# Patient Record
Sex: Female | Born: 1985 | Race: Black or African American | Hispanic: No | Marital: Single | Smoking: Never smoker
Health system: Southern US, Community
[De-identification: ages and names within clinical notes are randomized; demographics above are authoritative.]

## PROBLEM LIST (undated history)

## (undated) DIAGNOSIS — J45909 Unspecified asthma, uncomplicated: Secondary | ICD-10-CM

## (undated) DIAGNOSIS — M549 Dorsalgia, unspecified: Secondary | ICD-10-CM

## (undated) HISTORY — PX: OTHER SURGICAL HISTORY: SHX169

## (undated) HISTORY — DX: Dorsalgia, unspecified: M54.9

---
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
## 2007-11-29 ENCOUNTER — Emergency Department: Payer: Self-pay | Admitting: Internal Medicine

## 2008-02-04 ENCOUNTER — Emergency Department (HOSPITAL_COMMUNITY): Admission: EM | Admit: 2008-02-04 | Discharge: 2008-02-04 | Payer: Self-pay | Admitting: Emergency Medicine

## 2008-02-18 ENCOUNTER — Emergency Department: Payer: Self-pay | Admitting: Emergency Medicine

## 2008-02-20 ENCOUNTER — Inpatient Hospital Stay (HOSPITAL_COMMUNITY): Admission: AD | Admit: 2008-02-20 | Discharge: 2008-02-20 | Payer: Self-pay | Admitting: Obstetrics & Gynecology

## 2008-04-19 ENCOUNTER — Emergency Department: Payer: Self-pay | Admitting: Emergency Medicine

## 2008-08-11 ENCOUNTER — Emergency Department: Payer: Self-pay | Admitting: Emergency Medicine

## 2009-01-04 ENCOUNTER — Emergency Department (HOSPITAL_COMMUNITY): Admission: EM | Admit: 2009-01-04 | Discharge: 2009-01-04 | Payer: Self-pay | Admitting: Emergency Medicine

## 2009-09-10 ENCOUNTER — Emergency Department: Payer: Self-pay | Admitting: Emergency Medicine

## 2009-12-17 ENCOUNTER — Emergency Department: Payer: Self-pay | Admitting: Emergency Medicine

## 2011-02-13 LAB — URINALYSIS, ROUTINE W REFLEX MICROSCOPIC
Glucose, UA: NEGATIVE
Ketones, ur: NEGATIVE
Nitrite: NEGATIVE
Protein, ur: NEGATIVE
Urobilinogen, UA: 0.2

## 2011-02-13 LAB — WET PREP, GENITAL
Clue Cells Wet Prep HPF POC: NONE SEEN
Trich, Wet Prep: NONE SEEN
WBC, Wet Prep HPF POC: NONE SEEN
Yeast Wet Prep HPF POC: NONE SEEN

## 2011-02-14 LAB — POCT PREGNANCY, URINE: Preg Test, Ur: NEGATIVE

## 2012-06-05 ENCOUNTER — Ambulatory Visit: Payer: Self-pay | Admitting: Family Medicine

## 2012-06-05 LAB — RAPID INFLUENZA A&B ANTIGENS

## 2012-07-03 ENCOUNTER — Ambulatory Visit: Payer: Self-pay | Admitting: Internal Medicine

## 2012-07-31 ENCOUNTER — Emergency Department: Payer: Self-pay | Admitting: Emergency Medicine

## 2014-11-25 DIAGNOSIS — M549 Dorsalgia, unspecified: Secondary | ICD-10-CM | POA: Insufficient documentation

## 2014-11-25 HISTORY — DX: Dorsalgia, unspecified: M54.9

## 2015-01-21 ENCOUNTER — Encounter: Payer: Self-pay | Admitting: Emergency Medicine

## 2015-01-21 ENCOUNTER — Emergency Department
Admission: EM | Admit: 2015-01-21 | Discharge: 2015-01-21 | Disposition: A | Discharge status: Home / Self Care | Payer: BLUE CROSS/BLUE SHIELD | Attending: Emergency Medicine | Admitting: Emergency Medicine

## 2015-01-21 ENCOUNTER — Emergency Department: Payer: BLUE CROSS/BLUE SHIELD

## 2015-01-21 DIAGNOSIS — Y9389 Activity, other specified: Secondary | ICD-10-CM | POA: Diagnosis not present

## 2015-01-21 DIAGNOSIS — S20211A Contusion of right front wall of thorax, initial encounter: Secondary | ICD-10-CM | POA: Diagnosis not present

## 2015-01-21 DIAGNOSIS — S40812A Abrasion of left upper arm, initial encounter: Secondary | ICD-10-CM | POA: Insufficient documentation

## 2015-01-21 DIAGNOSIS — S301XXA Contusion of abdominal wall, initial encounter: Secondary | ICD-10-CM | POA: Diagnosis not present

## 2015-01-21 DIAGNOSIS — Z3202 Encounter for pregnancy test, result negative: Secondary | ICD-10-CM | POA: Diagnosis not present

## 2015-01-21 DIAGNOSIS — R102 Pelvic and perineal pain: Secondary | ICD-10-CM | POA: Diagnosis not present

## 2015-01-21 DIAGNOSIS — Y998 Other external cause status: Secondary | ICD-10-CM | POA: Diagnosis not present

## 2015-01-21 DIAGNOSIS — S299XXA Unspecified injury of thorax, initial encounter: Secondary | ICD-10-CM | POA: Diagnosis present

## 2015-01-21 DIAGNOSIS — Y9289 Other specified places as the place of occurrence of the external cause: Secondary | ICD-10-CM | POA: Insufficient documentation

## 2015-01-21 HISTORY — DX: Unspecified asthma, uncomplicated: J45.909

## 2015-01-21 LAB — BASIC METABOLIC PANEL
ANION GAP: 6 (ref 5–15)
BUN: 11 mg/dL (ref 6–20)
CALCIUM: 8.9 mg/dL (ref 8.9–10.3)
CO2: 27 mmol/L (ref 22–32)
Chloride: 107 mmol/L (ref 101–111)
Creatinine, Ser: 0.98 mg/dL (ref 0.44–1.00)
GFR calc non Af Amer: >60 mL/min (ref >60)
Glucose, Bld: 130 mg/dL — ABNORMAL HIGH (ref 65–99)
Potassium: 3.2 mmol/L — ABNORMAL LOW (ref 3.5–5.1)
SODIUM: 140 mmol/L (ref 135–145)

## 2015-01-21 LAB — URINALYSIS COMPLETE WITH MICROSCOPIC (ARMC ONLY)
BACTERIA UA: NONE SEEN
BILIRUBIN URINE: NEGATIVE
HGB URINE DIPSTICK: NEGATIVE
KETONES UR: NEGATIVE mg/dL
LEUKOCYTES UA: NEGATIVE
NITRITE: NEGATIVE
Protein, ur: NEGATIVE mg/dL
RBC / HPF: NONE SEEN RBC/hpf (ref 0–5)
SPECIFIC GRAVITY, URINE: 1.015 (ref 1.005–1.030)
pH: 6 (ref 5.0–8.0)

## 2015-01-21 MED ORDER — CYCLOBENZAPRINE HCL 5 MG PO TABS: 5.0000 mg | ORAL_TABLET | Freq: Three times a day (TID) | ORAL | Status: DC | PRN

## 2015-01-21 MED ORDER — IOHEXOL 300 MG/ML  SOLN
100.0000 mL | Freq: Once | INTRAMUSCULAR | Status: AC | PRN
  Administered 2015-01-21: 100 mL via INTRAVENOUS
  Filled 2015-01-21: qty 100

## 2015-01-21 MED ORDER — IBUPROFEN 800 MG PO TABS: 800.0000 mg | ORAL_TABLET | Freq: Three times a day (TID) | ORAL | Status: DC | PRN

## 2015-01-21 MED ORDER — CYCLOBENZAPRINE HCL 10 MG PO TABS
10.0000 mg | ORAL_TABLET | Freq: Once | ORAL | Status: AC
  Administered 2015-01-21: 10 mg via ORAL
  Filled 2015-01-21: qty 1

## 2015-01-21 MED ORDER — KETOROLAC TROMETHAMINE 60 MG/2ML IM SOLN
60.0000 mg | Freq: Once | INTRAMUSCULAR | Status: AC
  Administered 2015-01-21: 60 mg via INTRAMUSCULAR
  Filled 2015-01-21: qty 2

## 2015-01-21 NOTE — ED Notes (Signed)
POC pregnancy negative
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
## 2015-01-21 NOTE — Discharge Instructions (Signed)
Assault, General Assault includes any behavior, whether intentional or reckless, which results in bodily injury to another person and/or damage to property. Included in this would be any behavior, intentional or reckless, that by its nature would be understood (interpreted) by a reasonable person as intent to harm another person or to damage his/her property. Threats may be oral or written. They may be communicated through regular mail, computer, fax, or phone. These threats may be direct or implied. FORMS OF ASSAULT INCLUDE:  Physically assaulting a person. This includes physical threats to inflict physical harm as well as:  Slapping.  Hitting.  Poking.  Kicking.  Punching.  Pushing.  Arson.  Sabotage.  Equipment vandalism.  Damaging or destroying property.  Throwing or hitting objects.  Displaying a weapon or an object that appears to be a weapon in a threatening manner.  Carrying a firearm of any kind.  Using a weapon to harm someone.  Using greater physical size/strength to intimidate another.  Making intimidating or threatening gestures.  Bullying.  Hazing.  Intimidating, threatening, hostile, or abusive language directed toward another person.  It communicates the intention to engage in violence against that person. And it leads a reasonable person to expect that violent behavior may occur.  Stalking another person. IF IT HAPPENS AGAIN:  Immediately call for emergency help (911 in [place]  If someone poses clear and immediate danger to you, seek legal authorities to have a protective or restraining order put in place.  Less threatening assaults can at least be reported to authorities. STEPS TO TAKE IF A SEXUAL ASSAULT HAS HAPPENED  Go to an area of safety. This may include a shelter or staying with a friend. Stay away from the area where you have been attacked. A large percentage of sexual assaults are caused by a friend, relative or associate.  If  medications were given by your caregiver, take them as directed for the full length of time prescribed.  Only take over-the-counter or prescription medicines for pain, discomfort, or fever as directed by your caregiver.  If you have come in contact with a sexual disease, find out if you are to be tested again. If your caregiver is concerned about the HIV/AIDS virus, he/she may require you to have continued testing for several months.  For the protection of your privacy, test results can not be given over the phone. Make sure you receive the results of your test. If your test results are not back during your visit, make an appointment with your caregiver to find out the results. Do not assume everything is normal if you have not heard from your caregiver or the medical facility. It is important for you to follow up on all of your test results.  File appropriate papers with authorities. This is important in all assaults, even if it has occurred in a family or by a friend. SEEK MEDICAL CARE IF:  You have new problems because of your injuries.  You have problems that may be because of the medicine you are taking, such as:  Rash.  Itching.  Swelling.  Trouble breathing.  You develop belly (abdominal) pain, feel sick to your stomach (nausea) or are vomiting.  You begin to run a temperature.  You need supportive care or referral to a rape crisis center. These are centers with trained personnel who can help you get through this ordeal. SEEK IMMEDIATE MEDICAL CARE IF:  You are afraid of being threatened, beaten, or abused. In [place] call 911.  You  receive new injuries related to abuse.  You develop severe pain in any area injured in the assault or have any change in your condition that concerns you.  You faint or lose consciousness.  You develop chest pain or shortness of breath. Document Released: 05/01/2005 Document Revised: 07/24/2011 Document Reviewed: 12/18/2007 [facility] Patient  Information 2015 [place], [place]. This information is not intended to replace advice given to you by your health care provider. Make sure you discuss any questions you have with your health care provider.  Blunt Abdominal Trauma A blunt injury to the abdomen can cause pain. The pain is most likely from bruising and stretching of your muscles. This pain is often made worse with movement. Most often these injuries are not serious and get better within 1 week with rest and mild pain medicine. However, internal organs (liver, spleen, kidneys) can be injured with blunt trauma. If you do not get better or if you get worse, further examination may be needed. Continue with your regular daily activities, but avoid any strenuous activities until your pain is improved. If your stomach is upset, stick to a clear liquid diet and slowly advance to solid food.  SEEK IMMEDIATE MEDICAL CARE IF:   You develop increasing pain, nausea, or repeated vomiting.  You develop chest pain or breathing difficulty.  You develop blood in the urine, vomit, or stool.  You develop weakness, fainting, fever, or other serious complaints. Document Released: 06/08/2004 Document Revised: 07/24/2011 Document Reviewed: 09/24/2008 [facility] Patient Information 2015 [place], [place]. This information is not intended to replace advice given to you by your health care provider. Make sure you discuss any questions you have with your health care provider.  Chest Contusion A contusion is a deep bruise. Bruises happen when an injury causes bleeding under the skin. Signs of bruising include pain, puffiness (swelling), and discolored skin. The bruise may turn blue, purple, or yellow.  HOME CARE  Put ice on the injured area.  Put ice in a plastic bag.  Place a towel between the skin and the bag.  Leave the ice on for 15-20 minutes at a time, 03-04 times a day for the first 48 hours.  Only take medicine as told by your  doctor.  Rest.  Take deep breaths (deep-breathing exercises) as told by your doctor.  Stop smoking if you smoke.  Do not lift objects over 5 pounds (2.3 kilograms) for 3 days or longer if told by your doctor. GET HELP RIGHT AWAY IF:   You have more bruising or puffiness.  You have pain that gets worse.  You have trouble breathing.  You are dizzy, weak, or pass out (faint).  You have blood in your pee (urine) or poop (stool).  You cough up or throw up (vomit) blood.  Your puffiness or pain is not helped with medicines. MAKE SURE YOU:   Understand these instructions.  Will watch your condition.  Will get help right away if you are not doing well or get worse. Document Released: 10/18/2007 Document Revised: 01/24/2012 Document Reviewed: 10/23/2011 [facility] Patient Information 2015 [place], [place]. This information is not intended to replace advice given to you by your health care provider. Make sure you discuss any questions you have with your health care provider.  Rib Contusion A rib contusion (bruise) can occur by a blow to the chest or by a fall against a hard object. Usually these will be much better in a couple weeks. If X-rays were taken today and there are no  broken bones (fractures), the diagnosis of bruising is made. However, broken ribs may not show up for several days, or may be discovered later on a routine X-ray when signs of healing show up. If this happens to you, it does not mean that something was missed on the X-ray, but simply that it did not show up on the first X-rays. Earlier diagnosis will not usually change the treatment. HOME CARE INSTRUCTIONS   Avoid strenuous activity. Be careful during activities and avoid bumping the injured ribs. Activities that pull on the injured ribs and cause pain should be avoided, if possible.  For the first day or two, an ice pack used every 20 minutes while awake may be helpful. Put ice in a plastic bag and put a towel  between the bag and the skin.  Eat a normal, well-balanced diet. Drink plenty of fluids to avoid constipation.  Take deep breaths several times a day to keep lungs free of infection. Try to cough several times a day. Splint the injured area with a pillow while coughing to ease pain. Coughing can help prevent pneumonia.  Wear a rib belt or binder only if told to do so by your caregiver. If you are wearing a rib belt or binder, you must do the breathing exercises as directed by your caregiver. If not used properly, rib belts or binders restrict breathing which can lead to pneumonia.  Only take over-the-counter or prescription medicines for pain, discomfort, or fever as directed by your caregiver. SEEK MEDICAL CARE IF:   You or your child has an oral temperature above 102 F (38.9 C).  Your baby is older than 3 months with a rectal temperature of 100.5 F (38.1 C) or higher for more than 1 day.  You develop a cough, with thick or bloody sputum. SEEK IMMEDIATE MEDICAL CARE IF:   You have difficulty breathing.  You feel sick to your stomach (nausea), have vomiting or belly (abdominal) pain.  You have worsening pain, not controlled with medications, or there is a change in the location of the pain.  You develop sweating or radiation of the pain into the arms, jaw or shoulders, or become light headed or faint.  You or your child has an oral temperature above 102 F (38.9 C), not controlled by medicine.  Your or your baby is older than 3 months with a rectal temperature of 102 F (38.9 C) or higher.  Your baby is 85 months old or younger with a rectal temperature of 100.4 F (38 C) or higher. MAKE SURE YOU:   Understand these instructions.  Will watch your condition.  Will get help right away if you are not doing well or get worse. Document Released: 01/24/2001 Document Revised: 08/26/2012 Document Reviewed: 12/18/2007 [facility] Patient Information 2015 [place], [place]. This  information is not intended to replace advice given to you by your health care provider. Make sure you discuss any questions you have with your health care provider.

## 2015-01-21 NOTE — ED Notes (Signed)
Patient transported to CT 
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
## 2015-01-22 LAB — POCT PREGNANCY, URINE: PREG TEST UR: NEGATIVE

## 2015-09-23 DIAGNOSIS — M79643 Pain in unspecified hand: Secondary | ICD-10-CM | POA: Insufficient documentation

## 2015-09-28 ENCOUNTER — Encounter: Payer: Self-pay | Admitting: Internal Medicine

## 2015-09-29 ENCOUNTER — Ambulatory Visit: Payer: Self-pay | Admitting: Internal Medicine

## 2015-10-14 ENCOUNTER — Telehealth: Payer: Self-pay

## 2015-10-14 NOTE — Telephone Encounter (Signed)
advised

## 2015-10-14 NOTE — Telephone Encounter (Signed)
Can we do FMLA paper at her Establish Care for Carpal Tunnel? She has been out of work more than 3 days.

## 2015-10-14 NOTE — Telephone Encounter (Signed)
I can't make any promises since I have never seen her for or diagnosed her with carpal tunnel syndrome.

## 2015-11-03 ENCOUNTER — Other Ambulatory Visit: Payer: Self-pay | Admitting: Internal Medicine

## 2015-11-03 ENCOUNTER — Ambulatory Visit (INDEPENDENT_AMBULATORY_CARE_PROVIDER_SITE_OTHER): Payer: BLUE CROSS/BLUE SHIELD | Admitting: Internal Medicine

## 2015-11-03 ENCOUNTER — Encounter: Payer: Self-pay | Admitting: Internal Medicine

## 2015-11-03 VITALS — BP 116/76 | HR 84 | Resp 16 | Ht 61.2 in | Wt 206.0 lb

## 2015-11-03 DIAGNOSIS — M79643 Pain in unspecified hand: Secondary | ICD-10-CM

## 2015-11-03 DIAGNOSIS — J4520 Mild intermittent asthma, uncomplicated: Secondary | ICD-10-CM | POA: Diagnosis not present

## 2015-11-03 DIAGNOSIS — N760 Acute vaginitis: Secondary | ICD-10-CM | POA: Diagnosis not present

## 2015-11-03 MED ORDER — METRONIDAZOLE 500 MG PO TABS: 500.0000 mg | ORAL_TABLET | Freq: Two times a day (BID) | ORAL | Status: DC

## 2015-11-03 MED ORDER — FLUCONAZOLE 100 MG PO TABS: 100.0000 mg | ORAL_TABLET | Freq: Every day | ORAL | Status: DC

## 2015-11-03 MED ORDER — ALBUTEROL SULFATE HFA 108 (90 BASE) MCG/ACT IN AERS: 2.0000 | INHALATION_SPRAY | Freq: Four times a day (QID) | RESPIRATORY_TRACT | Status: DC | PRN
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
## 2015-12-01 ENCOUNTER — Encounter: Payer: Self-pay | Admitting: Internal Medicine

## 2015-12-01 ENCOUNTER — Ambulatory Visit (INDEPENDENT_AMBULATORY_CARE_PROVIDER_SITE_OTHER): Payer: BLUE CROSS/BLUE SHIELD | Admitting: Internal Medicine

## 2015-12-01 VITALS — BP 108/76 | HR 90 | Temp 98.4°F | Resp 16 | Ht 61.4 in | Wt 208.0 lb

## 2015-12-01 DIAGNOSIS — K047 Periapical abscess without sinus: Secondary | ICD-10-CM

## 2015-12-01 DIAGNOSIS — J029 Acute pharyngitis, unspecified: Secondary | ICD-10-CM

## 2015-12-01 MED ORDER — AMOXICILLIN-POT CLAVULANATE 875-125 MG PO TABS: 1.0000 | ORAL_TABLET | Freq: Two times a day (BID) | ORAL | Status: DC

## 2015-12-01 MED ORDER — IBUPROFEN 800 MG PO TABS: 800.0000 mg | ORAL_TABLET | Freq: Three times a day (TID) | ORAL | Status: DC | PRN
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
## 2016-05-12 ENCOUNTER — Other Ambulatory Visit: Payer: Self-pay | Admitting: Internal Medicine

## 2016-05-12 ENCOUNTER — Other Ambulatory Visit: Payer: Self-pay

## 2016-05-12 ENCOUNTER — Telehealth: Payer: Self-pay

## 2016-05-12 NOTE — Telephone Encounter (Signed)
Patient says she called several times last few weeks and never gets a call back. She said she gets put on hold for hours anytime she calls. Wanted appt at 4:30 today and I advised we can not see her today and offered her Tue appt. 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 I explained she has not seen us since July and that we can not send meds like that in without seeing patient. Scheduled for Jan 5. Patient aware we do not do narcotics.

## 2016-05-12 NOTE — Telephone Encounter (Signed)
She established care in June 2017 with me for the first time.  I saw once more for dental pain and prescribed Motrin.  Will see her at her appointment as scheduled.

## 2016-05-19 ENCOUNTER — Ambulatory Visit: Payer: Self-pay | Admitting: Internal Medicine

## 2016-05-22 ENCOUNTER — Ambulatory Visit (INDEPENDENT_AMBULATORY_CARE_PROVIDER_SITE_OTHER): Payer: BLUE CROSS/BLUE SHIELD | Admitting: Internal Medicine

## 2016-05-22 ENCOUNTER — Encounter: Payer: Self-pay | Admitting: Internal Medicine

## 2016-05-22 VITALS — BP 118/86 | HR 86 | Temp 97.6°F | Ht 62.0 in | Wt 196.0 lb

## 2016-05-22 DIAGNOSIS — M79643 Pain in unspecified hand: Secondary | ICD-10-CM | POA: Diagnosis not present

## 2016-05-22 DIAGNOSIS — G8929 Other chronic pain: Secondary | ICD-10-CM

## 2016-05-22 DIAGNOSIS — M542 Cervicalgia: Secondary | ICD-10-CM | POA: Diagnosis not present

## 2016-05-22 DIAGNOSIS — J4520 Mild intermittent asthma, uncomplicated: Secondary | ICD-10-CM | POA: Diagnosis not present

## 2016-05-22 MED ORDER — ALBUTEROL SULFATE HFA 108 (90 BASE) MCG/ACT IN AERS: 2.0000 | INHALATION_SPRAY | Freq: Four times a day (QID) | RESPIRATORY_TRACT | 5 refills | Status: DC | PRN

## 2016-05-22 MED ORDER — IBUPROFEN 800 MG PO TABS: 800.0000 mg | ORAL_TABLET | Freq: Three times a day (TID) | ORAL | 5 refills | Status: DC | PRN

## 2016-05-22 MED ORDER — CYCLOBENZAPRINE HCL 10 MG PO TABS: 10.0000 mg | ORAL_TABLET | Freq: Every day | ORAL | 3 refills | Status: DC
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
## 2016-05-24 ENCOUNTER — Encounter: Payer: Self-pay | Admitting: *Deleted

## 2016-05-24 ENCOUNTER — Ambulatory Visit
Admission: EM | Admit: 2016-05-24 | Discharge: 2016-05-24 | Disposition: A | Discharge status: Home / Self Care | Payer: Self-pay | Attending: Family Medicine | Admitting: Family Medicine

## 2016-05-24 DIAGNOSIS — K0510 Chronic gingivitis, plaque induced: Secondary | ICD-10-CM

## 2016-05-24 DIAGNOSIS — K029 Dental caries, unspecified: Secondary | ICD-10-CM

## 2016-05-24 MED ORDER — CHLORHEXIDINE GLUCONATE 0.12% ORAL RINSE (MEDLINE KIT): 15.0000 mL | Freq: Two times a day (BID) | OROMUCOSAL | 0 refills | Status: DC

## 2016-05-24 MED ORDER — KETOROLAC TROMETHAMINE 60 MG/2ML IM SOLN
60.0000 mg | Freq: Once | INTRAMUSCULAR | Status: AC
  Administered 2016-05-24: 60 mg via INTRAMUSCULAR

## 2016-05-24 MED ORDER — AMOXICILLIN 500 MG PO CAPS
500.0000 mg | ORAL_CAPSULE | Freq: Three times a day (TID) | ORAL | 0 refills | Status: DC
Start: 2016-05-24 — End: 2016-09-28

## 2016-05-24 NOTE — ED Triage Notes (Signed)
Pt had lower right molar extracted last summer, now c/o pain from socket that remains at extraction site.

## 2016-05-24 NOTE — ED Provider Notes (Signed)
CSN: [id number]     Arrival date & time 05/24/16  1935 History   None    Chief Complaint  Patient presents with  . Dental Problem   (Consider location/radiation/quality/duration/timing/severity/associated sxs/prior Treatment) HPI: Right lower molar pain, gradually worsening. Pain worse after eating.  Denies fever Past Medical History:  Diagnosis Date  . Asthma    Past Surgical History:  Procedure Laterality Date  . none     Family History  Problem Relation Age of Onset  . Hypertension Father   . CAD Father    Social History  Substance Use Topics  . Smoking status: Never Smoker  . Smokeless tobacco: Never Used  . Alcohol use 1.2 oz/week    2 Standard drinks or equivalent per week   OB History    No data available     Review of Systems: right lower tooth pain since Saturday, gradually worsening.  Allergies  Tomato  Home Medications   Prior to Admission medications   Medication Sig Start Date End Date Taking? Authorizing Provider  albuterol (PROAIR HFA) 108 (90 Base) MCG/ACT inhaler Inhale 2 puffs into the lungs every 6 (six) hours as needed for wheezing or shortness of breath. 05/22/16 05/22/17 Yes Glean Hess, MD  ibuprofen (ADVIL,MOTRIN) 800 MG tablet Take 1 tablet (800 mg total) by mouth every 8 (eight) hours as needed. 05/22/16  Yes Glean Hess, MD  amoxicillin (AMOXIL) 500 MG capsule Take 1 capsule (500 mg total) by mouth 3 (three) times daily. 05/24/16   Yolette Hastings, NP  chlorhexidine gluconate, MEDLINE KIT, (PERIDEX) 0.12 % solution Use as directed 15 mLs in the mouth or throat 2 (two) times daily. 05/24/16   Mina Carlisi, NP  cyclobenzaprine (FLEXERIL) 10 MG tablet Take 1 tablet (10 mg total) by mouth at bedtime. Allow 8 hours to elapse before operating a motor vehicle. 05/22/16   Glean Hess, MD   Meds Ordered and Administered this Visit   Medications  ketorolac (TORADOL) injection 60 mg (not administered)    BP 121/66 (BP Location: Left  Arm)   Pulse 78   Temp 98.7 F (37.1 C) (Oral)   Resp 16   Ht 5' 2"  (1.575 m)   Wt 190 lb (86.2 kg)   LMP 05/17/2016   SpO2 100%   BMI 34.75 kg/m  No data found.   Physical Exam  Constitutional: She appears well-developed and well-nourished. No distress.  HENT:  Mouth/Throat: Abnormal dentition. Dental abscesses and dental caries present.  Right lower molar partially missing with caries. Gingival swelling and erythema surrounding the tooth. Extreme Tender to palpation.  Urgent Care Course   Clinical Course     Procedures (including critical care time)  Labs Review Labs Reviewed - No data to display  Imaging Review No results found.   Visual Acuity Review  Right Eye Distance:   Left Eye Distance:   Bilateral Distance:    Right Eye Near:   Left Eye Near:    Bilateral Near:         MDM   1. Pain due to dental caries   2. Gingivitis   Tylenol/Motrin for pain PRN. FU with Dentist recommended   Errin Chewning [place], NP 05/24/16 2038

## 2016-09-28 ENCOUNTER — Other Ambulatory Visit: Payer: Self-pay | Admitting: Internal Medicine

## 2016-09-29 ENCOUNTER — Ambulatory Visit: Payer: Self-pay | Admitting: Physician Assistant

## 2016-09-29 ENCOUNTER — Ambulatory Visit: Payer: BLUE CROSS/BLUE SHIELD | Admitting: Physician Assistant

## 2016-11-28 ENCOUNTER — Ambulatory Visit
Admission: EM | Admit: 2016-11-28 | Discharge: 2016-11-28 | Disposition: A | Discharge status: Home / Self Care | Payer: Self-pay | Attending: Family Medicine | Admitting: Family Medicine

## 2016-11-28 ENCOUNTER — Encounter: Payer: Self-pay | Admitting: *Deleted

## 2016-11-28 DIAGNOSIS — M26602 Left temporomandibular joint disorder, unspecified: Secondary | ICD-10-CM

## 2016-11-28 DIAGNOSIS — H6692 Otitis media, unspecified, left ear: Secondary | ICD-10-CM

## 2016-11-28 DIAGNOSIS — M26622 Arthralgia of left temporomandibular joint: Secondary | ICD-10-CM

## 2016-11-28 MED ORDER — AMOXICILLIN-POT CLAVULANATE 875-125 MG PO TABS: 1.0000 | ORAL_TABLET | Freq: Two times a day (BID) | ORAL | 0 refills | Status: DC

## 2016-11-28 NOTE — Discharge Instructions (Signed)
Take medication as prescribed. Rest. Drink plenty of fluids.  ° °Follow up with your primary care physician this week as needed. Return to Urgent care for new or worsening concerns.  ° °

## 2016-11-28 NOTE — ED Triage Notes (Signed)
Pt c/o left anterior neck tenderness and left jaw and ear pain x3-5 days.
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
## 2016-12-27 ENCOUNTER — Ambulatory Visit: Payer: Self-pay | Admitting: Internal Medicine

## 2017-02-22 ENCOUNTER — Ambulatory Visit (INDEPENDENT_AMBULATORY_CARE_PROVIDER_SITE_OTHER): Payer: Self-pay | Admitting: Internal Medicine

## 2017-02-22 ENCOUNTER — Encounter: Payer: Self-pay | Admitting: Internal Medicine

## 2017-02-22 VITALS — BP 120/62 | HR 90 | Ht 62.0 in | Wt 201.0 lb

## 2017-02-22 DIAGNOSIS — L219 Seborrheic dermatitis, unspecified: Secondary | ICD-10-CM

## 2017-02-22 NOTE — Patient Instructions (Signed)
Take Claritin 10 mg once a day to help reduce itching.
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
## 2017-03-06 ENCOUNTER — Encounter: Payer: Self-pay | Admitting: Internal Medicine

## 2017-03-06 DIAGNOSIS — L219 Seborrheic dermatitis, unspecified: Secondary | ICD-10-CM | POA: Insufficient documentation

## 2017-06-26 ENCOUNTER — Other Ambulatory Visit: Payer: Self-pay

## 2017-06-26 ENCOUNTER — Other Ambulatory Visit: Payer: Self-pay | Admitting: Internal Medicine

## 2017-06-26 MED ORDER — ALBUTEROL SULFATE HFA 108 (90 BASE) MCG/ACT IN AERS: 2.0000 | INHALATION_SPRAY | Freq: Four times a day (QID) | RESPIRATORY_TRACT | 5 refills | Status: DC | PRN

## 2017-06-26 NOTE — Progress Notes (Signed)
Patient called needing refill on albuterol inhaler. [place] to [facility] in [place] and called patient to inform.

## 2017-10-23 ENCOUNTER — Other Ambulatory Visit: Payer: Self-pay | Admitting: Internal Medicine

## 2017-10-23 DIAGNOSIS — K047 Periapical abscess without sinus: Secondary | ICD-10-CM

## 2017-12-25 ENCOUNTER — Other Ambulatory Visit: Payer: Self-pay | Admitting: Internal Medicine

## 2017-12-25 DIAGNOSIS — K047 Periapical abscess without sinus: Secondary | ICD-10-CM

## 2017-12-26 ENCOUNTER — Other Ambulatory Visit: Payer: Self-pay | Admitting: Internal Medicine

## 2017-12-27 ENCOUNTER — Other Ambulatory Visit: Payer: Self-pay

## 2017-12-27 DIAGNOSIS — K047 Periapical abscess without sinus: Secondary | ICD-10-CM

## 2018-01-11 ENCOUNTER — Ambulatory Visit (INDEPENDENT_AMBULATORY_CARE_PROVIDER_SITE_OTHER): Payer: Self-pay | Admitting: Internal Medicine

## 2018-01-11 ENCOUNTER — Encounter: Payer: Self-pay | Admitting: Internal Medicine

## 2018-01-11 VITALS — BP 138/76 | HR 91 | Ht 62.0 in | Wt 217.0 lb

## 2018-01-11 DIAGNOSIS — M79643 Pain in unspecified hand: Secondary | ICD-10-CM

## 2018-01-11 DIAGNOSIS — M79671 Pain in right foot: Secondary | ICD-10-CM

## 2018-01-11 DIAGNOSIS — F5101 Primary insomnia: Secondary | ICD-10-CM

## 2018-01-11 MED ORDER — TRAZODONE HCL 50 MG PO TABS: 50.0000 mg | ORAL_TABLET | Freq: Every day | ORAL | 1 refills | Status: DC

## 2018-01-11 NOTE — Progress Notes (Signed)
Date:  01/11/2018   Name:  Monica Stevenson   DOB:  [birth year]   MRN:  [id number]   Chief Complaint: Foot Pain (Mainly right foot and leg pain but both causing issues. Started 2 months ago. Getting worse- on and off. Walking and increased activiity causes pain to be worse. ); Insomnia (Started over a year ago. Not sleeping. ); and Numbness (Happens in hands and arms. 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  ) Foot Injury   There was no injury mechanism. The pain is present in the right foot. The quality of the pain is described as burning and cramping. The pain is moderate. The pain has been intermittent since onset. Associated symptoms include numbness. The symptoms are aggravated by movement and weight bearing. She has tried NSAIDs for the symptoms. The treatment provided mild relief.  Insomnia  Primary symptoms: sleep disturbance, difficulty falling asleep, frequent awakening.  The problem occurs nightly. The problem is unchanged. 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 Typical bedtime:  8-10 P.M..   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  [facility]  Having more sx in the left hand and forearm, tingling in the wrist and moving up the arm.    Review of Systems  Constitutional: Negative for chills, fatigue and fever.  Eyes: Negative for visual disturbance.  Respiratory: Negative for cough, chest tightness, shortness of breath and wheezing.   Cardiovascular: Negative for chest pain and palpitations.  Musculoskeletal: Positive for arthralgias and gait problem.  Skin: Negative for rash and wound.  Neurological: Positive for numbness. Negative for dizziness, tremors, weakness and headaches.  Psychiatric/Behavioral: Positive for sleep disturbance. Negative for dysphoric mood and suicidal ideas. The patient has insomnia. The patient is not  nervous/anxious.     Patient Active Problem List   Diagnosis Date Noted  . Seborrheic dermatitis of scalp 03/06/2017  . Asthma 11/03/2015  . Hand discomfort 09/23/2015  . Back ache 11/25/2014    Allergies  Allergen Reactions  . Tomato Other (See Comments)    Past Surgical History:  Procedure Laterality Date  . none      Social History   Tobacco Use  . Smoking status: Never Smoker  . Smokeless tobacco: Never Used  Substance Use Topics  . Alcohol use: Yes    Alcohol/week: 2.0 standard drinks    Types: 2 Standard drinks or equivalent per week  . Drug use: No     Medication list has been reviewed and updated.  Current Meds  Medication Sig  . albuterol (PROAIR HFA) 108 (90 Base) MCG/ACT inhaler Inhale 2 puffs into the lungs every 6 (six) hours as needed for wheezing or shortness of breath.  . cyclobenzaprine (FLEXERIL) 10 MG tablet Take 1 tablet (10 mg total) by mouth at bedtime. Allow 8 hours to elapse before operating a motor vehicle.  . ibuprofen (ADVIL,MOTRIN) 800 MG tablet TAKE 1 TABLET(800 MG) BY MOUTH EVERY 8 HOURS AS NEEDED  . INTROVALE 0.15-0.03 MG tablet TK 1 T PO ONCE D AT THE SAME TIME EACH DAY    [facility] Sc[facility] 01/11/2018 02/22/2017 11/03/2015  PHQ - 2 Score 1 0 0    Physical Exam  Constitutional: She is oriented to person, place, and time. She appears well-developed. No distress.  HENT:  Head: Normocephalic and atraumatic.  Neck: Normal range of motion. Neck  supple.  Cardiovascular: Normal rate, regular rhythm and normal heart sounds.  Pulmonary/Chest: Effort normal and breath sounds normal. No respiratory distress.  Musculoskeletal: Normal range of motion.       Feet:  Calf muscles very tight - no cord or edema  Lymphadenopathy:    She has no cervical adenopathy.  Neurological: She is alert and oriented to person, place, and time. She has normal strength and normal reflexes. No cranial nerve deficit.  Skin: Skin is warm and dry. No rash noted.    Psychiatric: She has a normal mood and affect. Her behavior is normal. Thought content normal.  Nursing note and vitals reviewed.   BP 138/76 (BP Location: Right Arm, Patient Position: Sitting, Cuff Size: Normal)   Pulse 91   Ht 5\' 2"  (1.575 m)   Wt 217 lb (98.4 kg)   SpO2 98%   BMI 39.69 kg/m   Assessment and Plan: 1. 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 Foot pain, right Suspect Morton's neuroma rec follow up with Podiatry - pt will schedule with Miachel Roux in [facility]  3. Primary insomnia Begin low dose trazodone  - traZODone (DESYREL) 50 MG tablet; Take 1 tablet (50 mg total) by mouth at bedtime.  Dispense: 30 tablet; Refill: 1   Meds ordered this encounter  Medications  . traZODone (DESYREL) 50 MG tablet    Sig: Take 1 tablet (50 mg total) by mouth at bedtime.    Dispense:  30 tablet    Refill:  1    Partially dictated using Animal nutritionist. Any errors are unintentional.  Bari Edward, MD [facility]  01/11/2018   There are no diagnoses linked to this encounter.

## 2018-01-11 NOTE — Patient Instructions (Signed)
Dr. Miachel [facility]ouxhonda Cohen [place] [facility] [place]

## 2018-05-31 ENCOUNTER — Ambulatory Visit (INDEPENDENT_AMBULATORY_CARE_PROVIDER_SITE_OTHER): Payer: Self-pay | Admitting: Internal Medicine

## 2018-05-31 ENCOUNTER — Encounter: Payer: Self-pay | Admitting: Internal Medicine

## 2018-05-31 VITALS — BP 128/74 | HR 68 | Ht 62.0 in | Wt 216.4 lb

## 2018-05-31 DIAGNOSIS — K591 Functional diarrhea: Secondary | ICD-10-CM

## 2018-05-31 DIAGNOSIS — M542 Cervicalgia: Secondary | ICD-10-CM

## 2018-05-31 DIAGNOSIS — J4520 Mild intermittent asthma, uncomplicated: Secondary | ICD-10-CM

## 2018-05-31 DIAGNOSIS — G8929 Other chronic pain: Secondary | ICD-10-CM

## 2018-05-31 MED ORDER — CYCLOBENZAPRINE HCL 10 MG PO TABS: 10.0000 mg | ORAL_TABLET | Freq: Every day | ORAL | 3 refills | Status: DC

## 2018-05-31 MED ORDER — ALBUTEROL SULFATE HFA 108 (90 BASE) MCG/ACT IN AERS: 2.0000 | INHALATION_SPRAY | Freq: Four times a day (QID) | RESPIRATORY_TRACT | 5 refills | Status: DC | PRN

## 2018-05-31 NOTE — Progress Notes (Signed)
Date:  05/31/2018   Name:  Monica Stevenson   DOB:  [birth year]   MRN:  [id number]   Chief Complaint: Diarrhea (Started in Dec and seen OBGYN. Getting [place] down because of vaginal bleeding. On and off BV infections. Took two round of flagyl. Everytime she eats is diarrhea. Runny and very loose. Last two weeks is at its worse. ) and Sore Throat (Started last week with headaches and now throat is itchy. Both ears are hurting.)  Diarrhea   This is a new problem. The current episode started 1 to 4 weeks ago. The problem occurs 2 to 4 times per day. The problem has been gradually worsening. The stool consistency is described as watery and mucous. The patient states that diarrhea does not awaken her from sleep. Associated symptoms include abdominal pain, bloating, a fever and increased flatus. Pertinent negatives include no chills, coughing, vomiting or weight loss. Exacerbated by: eating. Risk factors include recent antibiotic use. She has tried nothing for the symptoms.  Asthma  There is no cough, shortness of breath or wheezing. This is a recurrent problem. The problem occurs rarely. Associated symptoms include a fever. Pertinent negatives include no chest pain or weight loss. Her past medical history is significant for asthma.    Review of Systems  Constitutional: Positive for fever. Negative for chills and weight loss.  Respiratory: Negative for cough, chest tightness, shortness of breath and wheezing.   Cardiovascular: Negative for chest pain and palpitations.  Gastrointestinal: Positive for abdominal pain, bloating, diarrhea and flatus. Negative for anal bleeding, blood in stool and vomiting.  Allergic/Immunologic: Negative for environmental allergies.  Psychiatric/Behavioral: Negative for dysphoric mood and sleep disturbance. The patient is not nervous/anxious.     Patient Active Problem List   Diagnosis Date Noted  . Seborrheic dermatitis of scalp 03/06/2017  . Asthma 11/03/2015  . Hand  discomfort 09/23/2015    Allergies  Allergen Reactions  . Tomato Other (See Comments)    Past Surgical History:  Procedure Laterality Date  . none      Social History   Tobacco Use  . Smoking status: Never Smoker  . Smokeless tobacco: Never Used  Substance Use Topics  . Alcohol use: Yes    Alcohol/week: 2.0 standard drinks    Types: 2 Standard drinks or equivalent per week  . Drug use: No     Medication list has been reviewed and updated.  Current Meds  Medication Sig  . albuterol (PROAIR HFA) 108 (90 Base) MCG/ACT inhaler Inhale 2 puffs into the lungs every 6 (six) hours as needed for wheezing or shortness of breath.  . cyclobenzaprine (FLEXERIL) 10 MG tablet Take 1 tablet (10 mg total) by mouth at bedtime. Allow 8 hours to elapse before operating a motor vehicle.  . ibuprofen (ADVIL,MOTRIN) 800 MG tablet TAKE 1 TABLET(800 MG) BY MOUTH EVERY 8 HOURS AS NEEDED  . traZODone (DESYREL) 50 MG tablet Take 1 tablet (50 mg total) by mouth at bedtime.    PHQ 2/9 Scores 05/31/2018 01/11/2018 02/22/2017 11/03/2015  PHQ - 2 Score 0 1 0 0   Wt Readings from Last 3 Encounters:  05/31/18 216 lb 6.4 oz (98.2 kg)  01/11/18 217 lb (98.4 kg)  02/22/17 201 lb (91.2 kg)    Physical Exam Vitals signs and nursing note reviewed.  Constitutional:      General: She is not in acute distress.    Appearance: She is well-developed.  HENT:     Head: Normocephalic and  atraumatic.     Mouth/Throat:     Mouth: Mucous membranes are moist.  Cardiovascular:     Rate and Rhythm: Normal rate and regular rhythm.     Pulses: Normal pulses.     Heart sounds: Normal heart sounds.  Pulmonary:     Effort: Pulmonary effort is normal. No respiratory distress.     Breath sounds: Normal breath sounds. No decreased breath sounds or wheezing.  Abdominal:     General: Bowel sounds are normal. There is no distension or abdominal bruit.     Palpations: Abdomen is soft. There is no shifting dullness, fluid  wave or hepatomegaly.     Tenderness: There is no abdominal tenderness. There is no guarding or rebound.  Musculoskeletal: Normal range of motion.  Skin:    General: Skin is warm and dry.     Findings: No rash.  Neurological:     Mental Status: She is alert and oriented to person, place, and time.  Psychiatric:        Behavior: Behavior normal.        Thought Content: Thought content normal.     BP 128/74   Pulse 68   Ht 5\' 2"  (1.575 m)   Wt 216 lb 6.4 oz (98.2 kg)   SpO2 97%   BMI 39.58 kg/m   Assessment and Plan: 1. Functional diarrhea Begin Kefir twice a day or otc probiotic capsule bid If persistent, call for further testing  2. Neck pain, chronic - cyclobenzaprine (FLEXERIL) 10 MG tablet; Take 1 tablet (10 mg total) by mouth at bedtime. Allow 8 hours to elapse before operating a motor vehicle.  Dispense: 30 tablet; Refill: 3  3. Mild intermittent asthma without complication stable - albuterol (PROAIR HFA) 108 (90 Base) MCG/ACT inhaler; Inhale 2 puffs into the lungs every 6 (six) hours as needed for wheezing or shortness of breath.  Dispense: 18 g; Refill: 5   Partially dictated using Animal nutritionist. Any errors are unintentional.  Bari Edward, MD [facility]  05/31/2018

## 2018-06-10 ENCOUNTER — Encounter: Payer: Self-pay | Admitting: Internal Medicine

## 2018-06-10 ENCOUNTER — Ambulatory Visit (INDEPENDENT_AMBULATORY_CARE_PROVIDER_SITE_OTHER): Payer: 59 | Admitting: Internal Medicine

## 2018-06-10 VITALS — BP 104/62 | HR 84 | Temp 98.6°F | Ht 62.0 in | Wt 219.0 lb

## 2018-06-10 DIAGNOSIS — K591 Functional diarrhea: Secondary | ICD-10-CM | POA: Diagnosis not present

## 2018-06-10 DIAGNOSIS — J0100 Acute maxillary sinusitis, unspecified: Secondary | ICD-10-CM

## 2018-06-10 DIAGNOSIS — J4520 Mild intermittent asthma, uncomplicated: Secondary | ICD-10-CM

## 2018-06-10 MED ORDER — PREDNISONE 10 MG PO TABS: 10.0000 mg | ORAL_TABLET | ORAL | 0 refills | Status: AC

## 2018-06-10 MED ORDER — AMOXICILLIN 875 MG PO TABS: 875.0000 mg | ORAL_TABLET | Freq: Two times a day (BID) | ORAL | 0 refills | Status: DC

## 2018-06-10 MED ORDER — ALBUTEROL SULFATE (2.5 MG/3ML) 0.083% IN NEBU: 2.5000 mg | INHALATION_SOLUTION | Freq: Four times a day (QID) | RESPIRATORY_TRACT | 0 refills | Status: DC | PRN

## 2018-06-10 NOTE — Progress Notes (Signed)
Date:  06/10/2018   Name:  Monica Stevenson   DOB:  09-15-1985   MRN:  [id number]   Chief Complaint: Cough (Cough with diarrhea. Bringing up lots of mucous due to taking mucinex. No fever. Feels like in chest. Last few days feels SOB especially when needed. Using brother nebulizer PRN. )  Cough  This is a new problem. The current episode started in the past 7 days. The problem has been gradually worsening. The problem occurs every few minutes. The cough is productive of sputum. Associated symptoms include nasal congestion, postnasal drip, shortness of breath and wheezing. Pertinent negatives include no chest pain, chills, fever or headaches. She has tried a beta-agonist inhaler for the symptoms. The treatment provided significant relief. Her past medical history is significant for asthma.  Diarrhea   The problem has been gradually improving. Associated symptoms include coughing. Pertinent negatives include no abdominal pain, chills, fever or headaches. Treatments tried: probiotics. The treatment provided moderate relief.  Sinusitis  This is a new problem. The current episode started in the past 7 days. The problem is unchanged. There has been no fever. Associated symptoms include coughing, shortness of breath and sinus pressure. Pertinent negatives include no chills or headaches. Past treatments include nothing.    Review of Systems  Constitutional: Negative for chills, fatigue and fever.  HENT: Positive for postnasal drip and sinus pressure.   Respiratory: Positive for cough, chest tightness, shortness of breath and wheezing.   Cardiovascular: Negative for chest pain and palpitations.  Gastrointestinal: Positive for diarrhea (stools about 4 times per day but becoming more formed). Negative for abdominal pain and blood in stool.  Neurological: Negative for dizziness, light-headedness and headaches.    Patient Active Problem List   Diagnosis Date Noted  . Seborrheic dermatitis of scalp  03/06/2017  . Asthma 11/03/2015  . Hand discomfort 09/23/2015    Allergies  Allergen Reactions  . Tomato Other (See Comments)    Past Surgical History:  Procedure Laterality Date  . none      Social History   Tobacco Use  . Smoking status: Never Smoker  . Smokeless tobacco: Never Used  Substance Use Topics  . Alcohol use: Yes    Alcohol/week: 2.0 standard drinks    Types: 2 Standard drinks or equivalent per week  . Drug use: No     Medication list has been reviewed and updated.  Current Meds  Medication Sig  . albuterol (PROAIR HFA) 108 (90 Base) MCG/ACT inhaler Inhale 2 puffs into the lungs every 6 (six) hours as needed for wheezing or shortness of breath.  . cyclobenzaprine (FLEXERIL) 10 MG tablet Take 1 tablet (10 mg total) by mouth at bedtime. Allow 8 hours to elapse before operating a motor vehicle.  . ibuprofen (ADVIL,MOTRIN) 800 MG tablet TAKE 1 TABLET(800 MG) BY MOUTH EVERY 8 HOURS AS NEEDED  . Probiotic Product (PROBIOTIC-10 PO) Take by mouth.  . traZODone (DESYREL) 50 MG tablet Take 1 tablet (50 mg total) by mouth at bedtime.    PHQ 2/9 Scores 05/31/2018 01/11/2018 02/22/2017 11/03/2015  PHQ - 2 Score 0 1 0 0    Physical Exam Constitutional:      Appearance: She is well-developed.  HENT:     Right Ear: Ear canal and external ear normal. Tympanic membrane is not erythematous or retracted.     Left Ear: Ear canal and external ear normal. Tympanic membrane is not erythematous or retracted.     Nose:  Right Sinus: Maxillary sinus tenderness and frontal sinus tenderness present.     Left Sinus: No maxillary sinus tenderness or frontal sinus tenderness.     Mouth/Throat:     Mouth: No oral lesions.     Pharynx: Uvula midline. Posterior oropharyngeal erythema present. No oropharyngeal exudate.  Cardiovascular:     Rate and Rhythm: Normal rate and regular rhythm.     Heart sounds: Normal heart sounds.  Pulmonary:     Breath sounds: Normal breath sounds. No  wheezing or rales.  Lymphadenopathy:     Cervical: No cervical adenopathy.  Neurological:     Mental Status: She is alert and oriented to person, place, and time.     BP 104/62   Pulse 84   Temp 98.6 F (37 C) (Oral)   Ht 5\' 2"  (1.575 m)   Wt 219 lb (99.3 kg)   SpO2 97%   BMI 40.06 kg/m   Assessment and Plan: 1. Acute non-recurrent maxillary sinusitis - amoxicillin (AMOXIL) 875 MG tablet; Take 1 tablet (875 mg total) by mouth 2 (two) times daily.  Dispense: 20 tablet; Refill: 0  2. Mild intermittent asthma without complication - predniSONE (DELTASONE) 10 MG tablet; Take 1 tablet (10 mg total) by mouth as directed for 6 days. Take 6,5,4,3,2,1 then stop  Dispense: 21 tablet; Refill: 0 - albuterol (PROVENTIL) (2.5 MG/3ML) 0.083% nebulizer solution; Take 3 mLs (2.5 mg total) by nebulization every 6 (six) hours as needed for wheezing or shortness of breath.  Dispense: 150 mL; Refill: 0  3. Functional diarrhea Continue probiotics   Partially dictated using Animal nutritionist. Any errors are unintentional.  Bari Edward, MD [facility]  06/10/2018

## 2018-07-01 ENCOUNTER — Ambulatory Visit: Payer: 59 | Admitting: Internal Medicine

## 2018-07-02 ENCOUNTER — Encounter: Payer: Self-pay | Admitting: Internal Medicine

## 2018-07-02 ENCOUNTER — Ambulatory Visit (INDEPENDENT_AMBULATORY_CARE_PROVIDER_SITE_OTHER): Payer: Self-pay | Admitting: Internal Medicine

## 2018-07-02 ENCOUNTER — Other Ambulatory Visit: Payer: Self-pay

## 2018-07-02 VITALS — BP 104/78 | HR 64 | Ht 62.0 in | Wt 219.0 lb

## 2018-07-02 DIAGNOSIS — J0100 Acute maxillary sinusitis, unspecified: Secondary | ICD-10-CM

## 2018-07-02 DIAGNOSIS — J4520 Mild intermittent asthma, uncomplicated: Secondary | ICD-10-CM

## 2018-07-02 DIAGNOSIS — K591 Functional diarrhea: Secondary | ICD-10-CM | POA: Insufficient documentation

## 2018-07-02 MED ORDER — ZITHROMAX Z-PAK 250 MG PO TABS: ORAL_TABLET | ORAL | 0 refills | Status: AC

## 2018-07-02 NOTE — Progress Notes (Signed)
Date:  07/02/2018   Name:  Monica Stevenson   DOB:  1985/10/27   MRN:  [id number]   Chief Complaint: Diarrhea (Still having diarrhea. Wants to know next step. ); Allergic Reaction (She started amoxicillin and took it for 3 days and had reaction of rash on face. ); and Asthma (Lost Rx for nebulizer machine. Needs a new one. )  Diarrhea   This is a chronic problem. The problem occurs 2 to 4 times per day. The problem has been gradually worsening. The stool consistency is described as watery. The patient states that diarrhea does not awaken her from sleep. Pertinent negatives include no abdominal pain, chills, coughing, fever, headaches, vomiting or weight loss.  Allergic Reaction  Associated symptoms include diarrhea. Pertinent negatives include no abdominal pain, chest pain, coughing, trouble swallowing, vomiting or wheezing. Her past medical history is significant for asthma.  Asthma  There is no cough, shortness of breath or wheezing. Associated symptoms include postnasal drip. Pertinent negatives include no chest pain, fever, headaches, trouble swallowing or weight loss. Her past medical history is significant for asthma.    Review of Systems  Constitutional: Negative for chills, fever, unexpected weight change and weight loss.  HENT: Positive for congestion, postnasal drip and sinus pressure. Negative for trouble swallowing and voice change.   Respiratory: Negative for cough, chest tightness, shortness of breath and wheezing.   Cardiovascular: Negative for chest pain and palpitations.  Gastrointestinal: Positive for diarrhea. Negative for abdominal pain and vomiting.  Allergic/Immunologic: Negative for environmental allergies.  Neurological: Negative for dizziness, light-headedness and headaches.    Patient Active Problem List   Diagnosis Date Noted  . Seborrheic dermatitis of scalp 03/06/2017  . Asthma 11/03/2015  . Hand discomfort 09/23/2015    Allergies  Allergen Reactions  .  Amoxicillin-Pot Clavulanate Hives  . Tomato Other (See Comments)    Past Surgical History:  Procedure Laterality Date  . none      Social History   Tobacco Use  . Smoking status: Never Smoker  . Smokeless tobacco: Never Used  Substance Use Topics  . Alcohol use: Yes    Alcohol/week: 2.0 standard drinks    Types: 2 Standard drinks or equivalent per week  . Drug use: No     Medication list has been reviewed and updated.  Current Meds  Medication Sig  . albuterol (PROAIR HFA) 108 (90 Base) MCG/ACT inhaler Inhale 2 puffs into the lungs every 6 (six) hours as needed for wheezing or shortness of breath.  Marland Kitchen albuterol (PROVENTIL) (2.5 MG/3ML) 0.083% nebulizer solution Take 3 mLs (2.5 mg total) by nebulization every 6 (six) hours as needed for wheezing or shortness of breath.  . cyclobenzaprine (FLEXERIL) 10 MG tablet Take 1 tablet (10 mg total) by mouth at bedtime. Allow 8 hours to elapse before operating a motor vehicle.  . ibuprofen (ADVIL,MOTRIN) 800 MG tablet TAKE 1 TABLET(800 MG) BY MOUTH EVERY 8 HOURS AS NEEDED  . Probiotic Product (PROBIOTIC-10 PO) Take by mouth.  . traZODone (DESYREL) 50 MG tablet Take 1 tablet (50 mg total) by mouth at bedtime.    PHQ 2/9 Scores 07/02/2018 05/31/2018 01/11/2018 02/22/2017  PHQ - 2 Score 0 0 1 0   Wt Readings from Last 3 Encounters:  07/02/18 219 lb (99.3 kg)  06/10/18 219 lb (99.3 kg)  05/31/18 216 lb 6.4 oz (98.2 kg)    Physical Exam Constitutional:      Appearance: Normal appearance. She is well-developed.  HENT:  Right Ear: Ear canal and external ear normal. Tympanic membrane is retracted. Tympanic membrane is not erythematous.     Left Ear: Ear canal and external ear normal. Tympanic membrane is retracted. Tympanic membrane is not erythematous.     Nose:     Right Sinus: Maxillary sinus tenderness present. No frontal sinus tenderness.     Left Sinus: Maxillary sinus tenderness present. No frontal sinus tenderness.      Mouth/Throat:     Mouth: No oral lesions.     Pharynx: Uvula midline. Posterior oropharyngeal erythema present. No oropharyngeal exudate.  Cardiovascular:     Rate and Rhythm: Normal rate and regular rhythm.     Heart sounds: Normal heart sounds.  Pulmonary:     Breath sounds: Normal breath sounds. No wheezing or rales.  Abdominal:     General: Bowel sounds are normal. There is no distension.     Palpations: Abdomen is soft.     Tenderness: There is no abdominal tenderness. There is no guarding or rebound.  Lymphadenopathy:     Cervical: No cervical adenopathy.  Neurological:     Mental Status: She is alert and oriented to person, place, and time.     BP 104/78   Pulse 64   Ht 5\' 2"  (1.575 m)   Wt 219 lb (99.3 kg)   SpO2 96%   BMI 40.06 kg/m   Assessment and Plan: 1. Acute non-recurrent maxillary sinusitis - ZITHROMAX Z-PAK 250 MG tablet; UAD  Dispense: 6 each; Refill: 0  2. Functional diarrhea Slowly improving - change to oral probiotics May need GI evaluation to rule out pathology but since no red flag symptoms, will hold see if she continues to improve  3. Mild intermittent asthma without complication - DME Nebulizer machine   Partially dictated using Animal nutritionist. Any errors are unintentional.  Bari Edward, MD [facility]  07/02/2018

## 2018-07-02 NOTE — Patient Instructions (Signed)
Probiotic capsule twice - look for one that most different strains

## 2019-01-15 ENCOUNTER — Other Ambulatory Visit: Payer: Self-pay

## 2019-01-15 ENCOUNTER — Ambulatory Visit (INDEPENDENT_AMBULATORY_CARE_PROVIDER_SITE_OTHER): Payer: Self-pay | Admitting: Internal Medicine

## 2019-01-15 ENCOUNTER — Encounter: Payer: Self-pay | Admitting: Internal Medicine

## 2019-01-15 VITALS — BP 116/76 | HR 83 | Temp 97.5°F | Resp 16 | Ht 62.0 in | Wt 220.0 lb

## 2019-01-15 DIAGNOSIS — J0100 Acute maxillary sinusitis, unspecified: Secondary | ICD-10-CM

## 2019-01-15 DIAGNOSIS — R1032 Left lower quadrant pain: Secondary | ICD-10-CM

## 2019-01-15 DIAGNOSIS — H6982 Other specified disorders of Eustachian tube, left ear: Secondary | ICD-10-CM

## 2019-01-15 LAB — POCT URINALYSIS DIPSTICK
Bilirubin, UA: NEGATIVE
Blood, UA: NEGATIVE
Glucose, UA: NEGATIVE
Ketones, UA: NEGATIVE
Leukocytes, UA: NEGATIVE
Nitrite, UA: NEGATIVE
Protein, UA: NEGATIVE
Spec Grav, UA: 1.02 (ref 1.010–1.025)
Urobilinogen, UA: 0.2 E.U./dL
pH, UA: 6.5 (ref 5.0–8.0)

## 2019-01-15 MED ORDER — AZITHROMYCIN 250 MG PO TABS: ORAL_TABLET | ORAL | 0 refills | Status: AC

## 2019-01-15 MED ORDER — FLUTICASONE PROPIONATE 50 MCG/ACT NA SUSP: 2.0000 | Freq: Every day | NASAL | 1 refills | Status: DC

## 2019-01-15 NOTE — Progress Notes (Signed)
Date:  01/15/2019   Name:  Monica Stevenson   DOB:  Jun 19, 1985   MRN:  [id number]   Chief Complaint: Ear Pain (head and ear and neck on L side hurting x 1 [place] ) and Abdominal Pain (LLQ pain after period. Hurts when walking mostly this started yesterday. )  Otalgia  There is pain in the left ear. The current episode started in the past 7 days. The problem has been unchanged. There has been no fever. The pain is mild. Associated symptoms include abdominal pain, headaches and neck pain. Pertinent negatives include no coughing, diarrhea, ear discharge, hearing loss, sore throat or vomiting. She has tried nothing for the symptoms.  Abdominal Pain This is a new problem. The current episode started yesterday. The onset quality is sudden. The problem has been unchanged. The pain is located in the LLQ. The pain is mild. The quality of the pain is a sensation of fullness. The abdominal pain does not radiate. Associated symptoms include headaches. Pertinent negatives include no diarrhea, fever, frequency, hematuria or vomiting. The pain is aggravated by movement.    Review of Systems  Constitutional: Negative for chills, fatigue and fever.  HENT: Positive for congestion, ear pain and sinus pressure. Negative for ear discharge, hearing loss, postnasal drip and sore throat.   Respiratory: Negative for cough and chest tightness.   Cardiovascular: Negative for chest pain, palpitations and leg swelling.  Gastrointestinal: Positive for abdominal pain. Negative for diarrhea and vomiting.  Genitourinary: Positive for menstrual problem (heavy bleeding). Negative for frequency and hematuria.  Musculoskeletal: Positive for neck pain.  Neurological: Positive for headaches.    Patient Active Problem List   Diagnosis Date Noted  . Mild intermittent asthma without complication 09/47/0962  . 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 Seborrheic dermatitis of scalp 03/06/2017  . Hand  discomfort 09/23/2015    Allergies  Allergen Reactions  . Amoxicillin Rash  . Tomato Other (See Comments)    Past Surgical History:  Procedure Laterality Date  . none      Social History   Tobacco Use  . Smoking status: Never Smoker  . Smokeless tobacco: Never Used  Substance Use Topics  . Alcohol use: Yes    Alcohol/week: 2.0 standard drinks    Types: 2 Standard drinks or equivalent per week  . Drug use: No     Medication list has been reviewed and updated.  Current Meds  Medication Sig  . albuterol (PROAIR HFA) 108 (90 Base) MCG/ACT inhaler Inhale 2 puffs into the lungs every 6 (six) hours as needed for wheezing or shortness of breath.  Marland Kitchen albuterol (PROVENTIL) (2.5 MG/3ML) 0.083% nebulizer solution Take 3 mLs (2.5 mg total) by nebulization every 6 (six) hours as needed for wheezing or shortness of breath.  . cyclobenzaprine (FLEXERIL) 10 MG tablet Take 1 tablet (10 mg total) by mouth at bedtime. Allow 8 hours to elapse before operating a motor vehicle.  . ibuprofen (ADVIL,MOTRIN) 800 MG tablet TAKE 1 TABLET(800 MG) BY MOUTH EVERY 8 HOURS AS NEEDED  . Probiotic Product (PROBIOTIC-10 PO) Take by mouth.  . traZODone (DESYREL) 50 MG tablet Take 1 tablet (50 mg total) by mouth at bedtime.    PHQ 2/9 Scores 07/02/2018 05/31/2018 01/11/2018 02/22/2017  PHQ - 2 Score 0 0 1 0    BP Readings from Last 3 Encounters:  01/15/19 116/76  07/02/18 104/78  06/10/18 104/62    Physical Exam Vitals signs and nursing note reviewed.  Constitutional:      General: She is not in acute distress.    Appearance: She is well-developed.  HENT:     Head: Normocephalic and atraumatic.     Right Ear: Ear canal and external ear normal. No middle ear effusion. Tympanic membrane is retracted. Tympanic membrane is not erythematous.     Left Ear: Ear canal and external ear normal.  No middle ear effusion. Tympanic membrane is retracted. Tympanic membrane is not erythematous.     Nose:     Right  Sinus: Maxillary sinus tenderness and frontal sinus tenderness present.     Left Sinus: Maxillary sinus tenderness and frontal sinus tenderness present.     Mouth/Throat:     Mouth: No oral lesions.  Cardiovascular:     Rate and Rhythm: Normal rate and regular rhythm.     Heart sounds: Normal heart sounds.  Pulmonary:     Effort: Pulmonary effort is normal. No respiratory distress.     Breath sounds: Normal breath sounds. No wheezing or rales.  Abdominal:     General: Bowel sounds are normal.     Palpations: Abdomen is soft. There is no hepatomegaly or splenomegaly.     Tenderness: There is abdominal tenderness in the left lower quadrant. There is no right CVA tenderness, left CVA tenderness, guarding or rebound.  Musculoskeletal: Normal range of motion.  Lymphadenopathy:     Cervical: Cervical adenopathy present.     Right cervical: Posterior cervical adenopathy present.     Left cervical: Posterior cervical adenopathy present.  Skin:    General: Skin is warm and dry.     Findings: No rash.  Neurological:     Mental Status: She is alert and oriented to person, place, and time.  Psychiatric:        Behavior: Behavior normal.        Thought Content: Thought content normal.     Wt Readings from Last 3 Encounters:  01/15/19 220 lb (99.8 kg)  07/02/18 219 lb (99.3 kg)  06/10/18 219 lb (99.3 kg)    BP 116/76   Pulse 83   Temp (!) 97.5 F (36.4 C) (Oral)   Resp 16   Ht 5\' 2"  (1.575 m)   Wt 220 lb (99.8 kg)   LMP 01/13/2019   SpO2 98%   BMI 40.24 kg/m   Assessment and Plan: 1. Acute non-recurrent maxillary sinusitis Continue mucinex-D and add Flonase spray Advil or Tylenol as needed for headache/ear pain - azithromycin (ZITHROMAX Z-PAK) 250 MG tablet; UAD  Dispense: 6 each; Refill: 0  2. Eustachian tube dysfunction, left - fluticasone (FLONASE) 50 MCG/ACT nasal spray; Place 2 sprays into both nostrils daily.  Dispense: 16 g; Refill: 1  3. LLQ abdominal pain UA  negative Suspect this is pelvic discomfort related to ovulation - POCT urinalysis dipstick   Partially dictated using Animal nutritionistDragon software. Any errors are unintentional.  Bari EdwardLaura Mekai Wilkinson, MD [facility]  01/15/2019

## 2019-01-16 ENCOUNTER — Telehealth: Payer: Self-pay | Admitting: Internal Medicine

## 2019-01-16 ENCOUNTER — Other Ambulatory Visit: Payer: Self-pay | Admitting: Internal Medicine

## 2019-01-16 DIAGNOSIS — G8929 Other chronic pain: Secondary | ICD-10-CM

## 2019-01-16 DIAGNOSIS — F5101 Primary insomnia: Secondary | ICD-10-CM

## 2019-01-16 MED ORDER — CYCLOBENZAPRINE HCL 10 MG PO TABS: 10.0000 mg | ORAL_TABLET | Freq: Every day | ORAL | 3 refills | Status: DC

## 2019-01-16 MED ORDER — TRAZODONE HCL 50 MG PO TABS: 50.0000 mg | ORAL_TABLET | Freq: Every day | ORAL | 5 refills | Status: DC

## 2019-01-16 NOTE — Telephone Encounter (Signed)
Needs refills sent in, forgot to mention to the doctor yesterday at her appt   cyclobenzaprine (FLEXERIL) 10 MG tablet [[id number]]    traZODone (DESYREL) 50 MG tablet [[id number]]

## 2019-01-16 NOTE — Telephone Encounter (Signed)
Patient needs RF- please see above.

## 2019-01-30 ENCOUNTER — Other Ambulatory Visit: Payer: Self-pay

## 2019-01-30 ENCOUNTER — Telehealth: Payer: Self-pay

## 2019-01-30 DIAGNOSIS — G8929 Other chronic pain: Secondary | ICD-10-CM

## 2019-01-30 DIAGNOSIS — M79671 Pain in right foot: Secondary | ICD-10-CM

## 2019-01-30 DIAGNOSIS — M542 Cervicalgia: Secondary | ICD-10-CM

## 2019-01-30 NOTE — Telephone Encounter (Signed)
Ordered for pain clinic [facility] for neck and back pain.  Ordered Podiatry for foot pain. Patient will await contact from Ref team.

## 2019-01-30 NOTE — Telephone Encounter (Signed)
Go ahead and do the referral.  My computer screen is about to go out.

## 2019-01-30 NOTE — Telephone Encounter (Signed)
Patient requesting referral for neck and back pain she still has. She said she called on 01/16/19 about back and foot referrals and has not heard back. Last seen 01/15/19 called forgot to mention the referrals needed at [facility]. Pls order referral for chronic back pain ASAP as she is taking her Flexeril but in pain and wants to see specialist. Advised I will call her back if she has to come in for this or she will get call from Ref team.

## 2019-03-25 ENCOUNTER — Telehealth: Payer: Self-pay

## 2019-03-25 ENCOUNTER — Other Ambulatory Visit: Payer: Self-pay

## 2019-03-25 DIAGNOSIS — M79671 Pain in right foot: Secondary | ICD-10-CM

## 2019-03-25 DIAGNOSIS — M79604 Pain in right leg: Secondary | ICD-10-CM

## 2019-03-25 DIAGNOSIS — G8929 Other chronic pain: Secondary | ICD-10-CM

## 2019-03-25 DIAGNOSIS — M549 Dorsalgia, unspecified: Secondary | ICD-10-CM

## 2019-03-25 DIAGNOSIS — M542 Cervicalgia: Secondary | ICD-10-CM

## 2019-03-25 DIAGNOSIS — M545 Low back pain, unspecified: Secondary | ICD-10-CM

## 2019-03-25 NOTE — Telephone Encounter (Signed)
I reviewed her chart and note that she was referred to Ortho several times over the past few years for foot pain and for back pain but apparently never went to the appointments due to billing issues. Now she is asking for a referral to Ortho for her back pain.  She was inadvertently referred to pain management and Podiatry which are not appropriate. New referral to [facility] in [place] will be placed.  She should continue nsaids and Flexeril until she is evaluated.

## 2019-03-25 NOTE — Progress Notes (Signed)
Called patient after speaking with Dr. Army Melia about her telephone call today ( documented from today 03/25/19 ) ...  Informed patient Dr. Army Melia tried to send her to 2 diff orthopedics in the past - [facility], and [facility]. It was documented that patient could not be seen due to billing issues.  Informed patient of this and asked her does she want to be seen at [facility] or [facility] orthopedics because this is who needs to see her to discuss her neck, back and right leg pain. She said [facility] in [place] is good.  Placed urgent referral for patient to be seen ASAP. Informed her Dr. Army Melia said the referral for podiatry and pain management was a error and she does not need to be seen by either of those. Podiatry told her they cannot see her because they only treat foot and ankle issues but not the leg.  Placed urgent referral and patient will call me in 48 hours if she has not heard anything back about this.

## 2019-03-25 NOTE — Telephone Encounter (Signed)
Patient informed- see referral note from today 03/25/2019.  Benedict Needy, CMA

## 2019-03-25 NOTE — Telephone Encounter (Signed)
Patient called and said she is "frustrated." Michela Pitcher she has not heard anything back about her pain management referral placed at the end of September along with her podiatry referral.  Patient said her pain is getting much worse. After speaking to the referrals department today, she found the referral can take 4-6 weeks to review for pain management but her pain is getting much worse and its been longer than 4-6 weeks.  She also stated she mentioned to [place] at her last visit about her foot pain that radiates up into her leg. Her right leg is getting much worse than her left. She said podiatry today her today when she called that they only examine feet and ankles but not legs. 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 She said she feels numbness and tingling in the foot and leg. Said her leg also shakes from time to time when this happens. She is concerned even more now that her father had to have surgery on his legs yesterday for blood clots.   She said she doesn't understand why her referrals are not getting her any help. She needs to see a pain management clinic asap for back and neck pain. And also needs to know where we need to refer her for this pain in her leg that goes numb and causes shaking.  Please advise on what would be best for this patient and I told her I will call her back after lunch.  Benedict Needy, CMA

## 2019-03-28 ENCOUNTER — Telehealth: Payer: Self-pay

## 2019-03-28 NOTE — Telephone Encounter (Signed)
Patient called stating she has not heard anything back from [facility] about her referral to ortho yet. She is still in a lot of pain in her back and leg.  Spoke with the referrals team. They contacted [facility] who told them they are "checking on some things" and then they will contact the patient.  Called and informed patient [facility] should be contacting her soon. She said she will await their call.  Benedict Needy, CMA

## 2019-03-31 ENCOUNTER — Telehealth: Payer: Self-pay

## 2019-03-31 DIAGNOSIS — G8929 Other chronic pain: Secondary | ICD-10-CM | POA: Insufficient documentation

## 2019-03-31 DIAGNOSIS — M25561 Pain in right knee: Secondary | ICD-10-CM | POA: Insufficient documentation

## 2019-03-31 DIAGNOSIS — M222X1 Patellofemoral disorders, right knee: Secondary | ICD-10-CM | POA: Insufficient documentation

## 2019-03-31 DIAGNOSIS — M94261 Chondromalacia, right knee: Secondary | ICD-10-CM | POA: Insufficient documentation

## 2019-03-31 NOTE — Telephone Encounter (Signed)
Patient called informing us that Orthopedics scheduled her today at 1 PM. She wanted to call and let us know and thank [place] for getting her in.  Rosalie.  Benedict Needy, CMA

## 2019-04-26 ENCOUNTER — Other Ambulatory Visit: Payer: Self-pay

## 2019-04-26 ENCOUNTER — Ambulatory Visit
Admission: EM | Admit: 2019-04-26 | Discharge: 2019-04-26 | Disposition: A | Discharge status: Home / Self Care | Payer: Self-pay | Attending: Family Medicine | Admitting: Family Medicine

## 2019-04-26 ENCOUNTER — Ambulatory Visit (INDEPENDENT_AMBULATORY_CARE_PROVIDER_SITE_OTHER): Payer: Self-pay

## 2019-04-26 ENCOUNTER — Encounter: Payer: Self-pay | Admitting: Emergency Medicine

## 2019-04-26 DIAGNOSIS — Y9383 Activity, rough housing and horseplay: Secondary | ICD-10-CM

## 2019-04-26 DIAGNOSIS — M25561 Pain in right knee: Secondary | ICD-10-CM

## 2019-04-26 MED ORDER — MELOXICAM 15 MG PO TABS: 15.0000 mg | ORAL_TABLET | Freq: Every day | ORAL | 0 refills | Status: DC | PRN
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
## 2019-04-26 NOTE — ED Triage Notes (Signed)
Patient states that she was playing around with her sister  and her sister landed on her right knee. Patient c/o pain in her right knee.

## 2019-04-26 NOTE — Discharge Instructions (Signed)
Xray negative.  Rest, ice, elevation.  Medication as directed.  Brace as needed.  Take care  Dr. Lacinda Axon

## 2019-05-01 ENCOUNTER — Encounter: Payer: Self-pay | Admitting: Internal Medicine

## 2019-05-01 ENCOUNTER — Ambulatory Visit (INDEPENDENT_AMBULATORY_CARE_PROVIDER_SITE_OTHER): Payer: Self-pay | Admitting: Internal Medicine

## 2019-05-01 ENCOUNTER — Other Ambulatory Visit: Payer: Self-pay

## 2019-05-01 VITALS — BP 132/78 | HR 102 | Ht 62.0 in | Wt 221.0 lb

## 2019-05-01 DIAGNOSIS — S86911A Strain of unspecified muscle(s) and tendon(s) at lower leg level, right leg, initial encounter: Secondary | ICD-10-CM

## 2019-05-01 MED ORDER — TRAMADOL HCL 50 MG PO TABS: 50.0000 mg | ORAL_TABLET | Freq: Three times a day (TID) | ORAL | 0 refills | Status: DC | PRN

## 2019-05-01 NOTE — Progress Notes (Signed)
Date:  05/01/2019   Name:  Monica Stevenson   DOB:  16-Aug-1985   MRN:  [id number]   Chief Complaint: Knee Pain (Right knee pain. Fell on 04/26/2019. Given meloxicam - not helping the pain. )  Knee Pain  The incident occurred 5 to 7 days ago. The incident occurred at home. The injury mechanism was a direct blow (her sister rolled onto her outstretched knee causing immediate pain). The pain is present in the left knee. The quality of the pain is described as aching. The pain is moderate. Pertinent negatives include no loss of sensation, numbness or tingling. The symptoms are aggravated by weight bearing and movement. She has tried immobilization and NSAIDs for the symptoms. The treatment provided mild relief.    Lab Results  Component Value Date   CREATININE 0.98 01/21/2015   BUN 11 01/21/2015   NA 140 01/21/2015   K 3.2 (L) 01/21/2015   CL 107 01/21/2015   CO2 27 01/21/2015   No results found for: CHOL, HDL, LDLCALC, LDLDIRECT, TRIG, CHOLHDL No results found for: TSH No results found for: HGBA1C   Review of Systems  Constitutional: Negative for chills and fatigue.  Respiratory: Negative for chest tightness and shortness of breath.   Cardiovascular: Negative for chest pain and leg swelling.  Musculoskeletal: Positive for arthralgias, gait problem and myalgias. Negative for joint swelling.  Neurological: Negative for tingling and numbness.    Patient Active Problem List   Diagnosis Date Noted  . Mild intermittent asthma without complication 89/37/3428  . 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 Seborrheic dermatitis of scalp 03/06/2017  . Hand discomfort 09/23/2015    Allergies  Allergen Reactions  . Amoxicillin Rash  . Tomato Other (See Comments)    Past Surgical History:  Procedure Laterality Date  . none      Social History   Tobacco Use  . Smoking status: Never Smoker  . Smokeless tobacco: Never Used  Substance Use Topics  . Alcohol use: Yes    Alcohol/week: 2.0  standard drinks    Types: 2 Standard drinks or equivalent per week  . Drug use: No     Medication list has been reviewed and updated.  No outpatient medications have been marked as taking for the 05/01/19 encounter (Office Visit) with Glean Hess, MD.    [facility] 2/9 Scores 07/02/2018 05/31/2018 01/11/2018 02/22/2017  PHQ - 2 Score 0 0 1 0    BP Readings from Last 3 Encounters:  05/01/19 132/78  04/26/19 100/79  01/15/19 116/76    Physical Exam Vitals and nursing note reviewed.  Constitutional:      General: She is not in acute distress.    Appearance: She is well-developed.  HENT:     Head: Normocephalic and atraumatic.  Cardiovascular:     Rate and Rhythm: Normal rate and regular rhythm.  Pulmonary:     Effort: Pulmonary effort is normal. No respiratory distress.     Breath sounds: No wheezing or rhonchi.  Musculoskeletal:     Right knee: No swelling or deformity. Decreased range of motion. Tenderness present over the medial joint line and lateral joint line. Normal pulse.  Skin:    General: Skin is warm and dry.     Findings: No rash.  Neurological:     Mental Status: She is alert and oriented to person, place, and time.  Psychiatric:        Behavior: Behavior normal.        Thought Content:  Thought content normal.     Wt Readings from Last 3 Encounters:  05/01/19 221 lb (100.2 kg)  04/26/19 230 lb (104.3 kg)  01/15/19 220 lb (99.8 kg)    BP 132/78   Pulse (!) 102   Ht 5\' 2"  (1.575 m)   Wt 221 lb (100.2 kg)   LMP 04/18/2019 (Approximate)   SpO2 96%   BMI 40.42 kg/m   Assessment and Plan: 1. Strain of right knee, initial encounter Hopefully there is no ligament injury although I am suspicious of this.   She will continue immobilization, Mobic daily and will give her a few tramadol for pain.  She is advised not to drive while taking tramadol. If no improvement in several weeks, will recommend referral to Ortho - traMADol (ULTRAM) 50 MG tablet; Take 1  tablet (50 mg total) by mouth every 8 (eight) hours as needed for up to 5 days.  Dispense: 15 tablet; Refill: 0  Also advised flu vaccine - recommended she go to [facility] - cost for her will be much less.  Partially dictated using [facility]. Any errors are unintentional.  Animal nutritionist, MD [facility]  05/01/2019

## 2019-06-26 ENCOUNTER — Telehealth: Payer: Self-pay | Admitting: Internal Medicine

## 2019-06-26 NOTE — Telephone Encounter (Signed)
Patient is also asking for tramadol for pain. She said that dr berglund said that when she came in the last time dr berglund said give it to more weeks for her knee to improve and if things do not get better for her to ask dr berglund to put in a referral for a MRI. I stated to Monica Stevenson that she would need to contact her Orthopaedic doctor for the MRI, but again Monica Stevenson said that Dr Judithann Graves told her to call back to have the MRI put in.

## 2019-06-26 NOTE — Telephone Encounter (Signed)
She will need to request this from her orthopedic specialist. We do not order these typically. Please let her know.

## 2019-06-26 NOTE — Telephone Encounter (Signed)
Tried to call patient back in reference to chassidy response, the patient phone number can not be reached at this time. I will try to call her back later.

## 2019-06-26 NOTE — Telephone Encounter (Signed)
Patient is requesting an order for the MRI on leg.

## 2019-06-27 ENCOUNTER — Other Ambulatory Visit: Payer: Self-pay | Admitting: Internal Medicine

## 2019-06-27 ENCOUNTER — Other Ambulatory Visit: Payer: Self-pay

## 2019-06-27 DIAGNOSIS — S86911A Strain of unspecified muscle(s) and tendon(s) at lower leg level, right leg, initial encounter: Secondary | ICD-10-CM

## 2019-06-27 MED ORDER — TRAMADOL HCL 50 MG PO TABS: 50.0000 mg | ORAL_TABLET | Freq: Three times a day (TID) | ORAL | 0 refills | Status: AC | PRN

## 2019-06-27 NOTE — Telephone Encounter (Signed)
She may have misunderstood me.  I never order knee MRIs.

## 2019-06-27 NOTE — Telephone Encounter (Signed)
Please advise previous message per Marchelle Folks.

## 2019-06-27 NOTE — Telephone Encounter (Signed)
Patient informed. Place referral for Dr Tennis Ship for her right knee. Patient would like a refill of tramadol for her knee pain until seeing the specialist. [facility] in [place].

## 2019-07-08 ENCOUNTER — Other Ambulatory Visit: Payer: Self-pay | Admitting: Internal Medicine

## 2019-07-08 DIAGNOSIS — J4520 Mild intermittent asthma, uncomplicated: Secondary | ICD-10-CM

## 2019-07-08 DIAGNOSIS — K047 Periapical abscess without sinus: Secondary | ICD-10-CM

## 2019-08-18 ENCOUNTER — Other Ambulatory Visit: Payer: Self-pay | Admitting: Internal Medicine

## 2019-08-18 DIAGNOSIS — K047 Periapical abscess without sinus: Secondary | ICD-10-CM

## 2019-09-12 ENCOUNTER — Telehealth: Payer: Self-pay | Admitting: Internal Medicine

## 2019-09-12 NOTE — Telephone Encounter (Signed)
I called pt to tell her that we couldn't call her in something controlled across state lines. However, she stated another doctor had called her in some medicine. She also asked if Jarold Motto could deny the MRI that Dr B had requested? I saw the note from [facility] that stated PT was ordered but pt. didn't go through with it. I explained that things had to be done in order for insurance to pay. She stated she was paying out of pocket and would talk to [facility] further on Monday.

## 2019-09-12 NOTE — Telephone Encounter (Signed)
Copied from CRM [id number]. Topic: General - Other >> Sep 12, 2019  3:07 PM Tamela Oddi wrote: Reason for CRM: Patient called to ask for some medication for her pain.  She is currently traveling in [place] and would like the medication sent to a pharmacy there.  Please advise and call patient to confirm today at [phone]

## 2019-09-17 ENCOUNTER — Other Ambulatory Visit: Payer: Self-pay

## 2019-09-17 ENCOUNTER — Telehealth: Payer: Self-pay | Admitting: Internal Medicine

## 2019-09-17 DIAGNOSIS — R0683 Snoring: Secondary | ICD-10-CM

## 2019-09-17 NOTE — Telephone Encounter (Signed)
Placed referral to Dr Shyrl Numbers for patient for snoring more Dr Karn Cassis okay.  CM

## 2019-09-17 NOTE — Telephone Encounter (Unsigned)
Copied from CRM #[id number]. Topic: Referral - Request for Referral >> Sep 17, 2019 11:59 AM Robinson, Norma J wrote: Has patient seen PCP for this complaint? Yes . Pt said the ENT she was referred to does not accept her [employer] insurance.Pt needs another referral to dr kathy Yu phone number [phone]. Pt needs to see ENT for snoring and her breathing 

## 2019-10-28 ENCOUNTER — Other Ambulatory Visit: Payer: Self-pay

## 2019-10-28 ENCOUNTER — Encounter: Payer: Self-pay | Admitting: Family Medicine

## 2019-10-28 ENCOUNTER — Ambulatory Visit: Payer: Self-pay | Admitting: Family Medicine

## 2019-10-28 VITALS — BP 129/65 | HR 73 | Temp 97.1°F | Ht 62.0 in | Wt 224.0 lb

## 2019-10-28 DIAGNOSIS — Z024 Encounter for examination for driving license: Secondary | ICD-10-CM

## 2019-10-28 LAB — POCT URINALYSIS DIPSTICK
Bilirubin, UA: NEGATIVE
Blood, UA: NEGATIVE
Glucose, UA: NEGATIVE
Ketones, UA: NEGATIVE
Leukocytes, UA: NEGATIVE
Nitrite, UA: NEGATIVE
Protein, UA: NEGATIVE
Spec Grav, UA: 1.02 (ref 1.010–1.025)
Urobilinogen, UA: 0.2 E.U./dL
pH, UA: 5 (ref 5.0–8.0)

## 2019-10-28 NOTE — Assessment & Plan Note (Signed)
DOT Certificate provided for 3 months.  Expiring 01/28/2020.

## 2019-10-28 NOTE — Patient Instructions (Signed)
DOT PE - certificate for 3 months, expires 01/28/2020

## 2019-10-28 NOTE — Progress Notes (Signed)
Subjective:    Patient ID: Monica Stevenson, female    DOB: Jul 16, 1985, 34 y.o.   MRN: [id number]  Monica Stevenson is a 34 y.o. female presenting on 10/28/2019 for Employment Physical (DOT Physical)   HPI  Monica Stevenson presents to clinic for DOT Physical.  Depression screen [facility] 2/9 07/02/2018 05/31/2018 01/11/2018  Decreased Interest 0 0 0  Down, Depressed, Hopeless 0 0 1  PHQ - 2 Score 0 0 1    Social History   Tobacco Use  . Smoking status: Never Smoker  . Smokeless tobacco: Never Used  Vaping Use  . Vaping Use: Never used  Substance Use Topics  . Alcohol use: Yes    Alcohol/week: 2.0 standard drinks    Types: 2 Standard drinks or equivalent per week  . Drug use: No    Review of Systems  Constitutional: Negative.   HENT: Negative.   Eyes: Negative.   Respiratory: Negative.   Cardiovascular: Negative.   Gastrointestinal: Negative.   Endocrine: Negative.   Genitourinary: Negative.   Musculoskeletal: Negative.   Skin: Negative.   Allergic/Immunologic: Negative.   Neurological: Negative.   Hematological: Negative.   Psychiatric/Behavioral: Negative.    Per HPI unless specifically indicated above     Objective:    BP 129/65 (BP Location: Left Arm, Patient Position: Sitting, Cuff Size: Normal)   Pulse 73   Temp (!) 97.1 F (36.2 C) (Temporal)   Ht 5\' 2"  (1.575 m)   Wt 224 lb (101.6 kg)   BMI 40.97 kg/m   Wt Readings from Last 3 Encounters:  10/28/19 224 lb (101.6 kg)  05/01/19 221 lb (100.2 kg)  04/26/19 230 lb (104.3 kg)    Physical Exam Constitutional:      General: She is not in acute distress.    Appearance: Normal appearance. She is well-developed and well-groomed. She is obese. She is not ill-appearing or toxic-appearing.  HENT:     Head: Normocephalic and atraumatic.     Right Ear: Tympanic membrane, ear canal and external ear normal. There is no impacted cerumen.     Left Ear: Tympanic membrane, ear canal and external ear normal. There is no impacted  cerumen.     Nose: Nose normal. No congestion.     Mouth/Throat:     Mouth: Mucous membranes are moist.     Pharynx: No oropharyngeal exudate or posterior oropharyngeal erythema.  Eyes:     General: Lids are normal. Vision grossly intact. No scleral icterus.       Right eye: No discharge.        Left eye: No discharge.     Extraocular Movements: Extraocular movements intact.     Conjunctiva/sclera: Conjunctivae normal.     Pupils: Pupils are equal, round, and reactive to light.  Neck:     Thyroid: No thyroid mass, thyromegaly or thyroid tenderness.  Cardiovascular:     Rate and Rhythm: Normal rate and regular rhythm.     Pulses: Normal pulses.          Dorsalis pedis pulses are 2+ on the right side and 2+ on the left side.     Heart sounds: Normal heart sounds. No murmur heard.  No friction rub. No gallop.   Pulmonary:     Effort: Pulmonary effort is normal. No respiratory distress.     Breath sounds: Normal breath sounds.  Abdominal:     General: Abdomen is flat. Bowel sounds are normal. There is no distension.  Palpations: Abdomen is soft. There is no mass.     Tenderness: There is no abdominal tenderness. There is no guarding or rebound.     Hernia: No hernia is present.  Musculoskeletal:        General: Normal range of motion.     Cervical back: Normal range of motion and neck supple. No tenderness.     Right lower leg: No edema.     Left lower leg: No edema.     Comments: Normal Tone, 5/5 strength BUE & BLE  Feet:     Right foot:     Skin integrity: Skin integrity normal.     Left foot:     Skin integrity: Skin integrity normal.  Lymphadenopathy:     Cervical: No cervical adenopathy.  Skin:    General: Skin is warm and dry.     Capillary Refill: Capillary refill takes less than 2 seconds.  Neurological:     General: No focal deficit present.     Mental Status: She is alert and oriented to person, place, and time.     Cranial Nerves: No cranial nerve deficit.      Sensory: No sensory deficit.     Motor: No weakness.     Coordination: Coordination normal.     Gait: Gait normal.     Deep Tendon Reflexes: Reflexes normal.  Psychiatric:        Attention and Perception: Attention and perception normal.        Mood and Affect: Mood and affect normal.        Speech: Speech normal.        Behavior: Behavior normal. Behavior is cooperative.        Thought Content: Thought content normal.        Cognition and Memory: Cognition and memory normal.        Judgment: Judgment normal.    Results for orders placed or performed in visit on 10/28/19  POCT Urinalysis Dipstick  Result Value Ref Range   Color, UA Yellow    Clarity, UA clear    Glucose, UA Negative Negative   Bilirubin, UA negative    Ketones, UA negative    Spec Grav, UA 1.020 1.010 - 1.025   Blood, UA negative    pH, UA 5.0 5.0 - 8.0   Protein, UA Negative Negative   Urobilinogen, UA 0.2 0.2 or 1.0 E.U./dL   Nitrite, UA negative    Leukocytes, UA Negative Negative   Appearance     Odor        Assessment & Plan:   Problem List Items Addressed This Visit      Other   Encounter for commercial driving license (CDL) exam    DOT Certificate provided for 3 months.  Expiring 01/28/2020.       Other Visit Diagnoses    Encounter for CDL (commercial driving license) exam    -  Primary   Relevant Orders   POCT Urinalysis Dipstick (Completed)      No orders of the defined types were placed in this encounter.     Follow up plan: Return in about 3 months (around 01/28/2020) for DOT PE.   Harlin Rain, [place] [facility] Nurse Practitioner [facility] 10/28/2019, 4:34 PM

## 2019-11-13 ENCOUNTER — Telehealth: Payer: Self-pay

## 2019-11-13 NOTE — Telephone Encounter (Signed)
Copied from CRM [id number]. Topic: Referral - Request for Referral >> Sep 17, 2019 11:59 AM Leafy Ro wrote: Has patient seen PCP for this complaint? Yes . Pt said the ENT she was referred to does not accept her [employer].Pt needs another referral to dr Shyrl Numbers phone number [phone]. Pt needs to see ENT for snoring and her breathing

## 2019-11-14 ENCOUNTER — Other Ambulatory Visit: Payer: Self-pay

## 2019-11-14 DIAGNOSIS — R0683 Snoring: Secondary | ICD-10-CM

## 2019-11-14 NOTE — Telephone Encounter (Signed)
Referral placed for Dr Shyrl Numbers.  CM

## 2019-11-18 ENCOUNTER — Telehealth: Payer: Self-pay | Admitting: Internal Medicine

## 2019-11-18 NOTE — Telephone Encounter (Unsigned)
Copied from CRM [id number]. Topic: General - Other >> Nov 18, 2019 10:08 AM Gwenlyn Fudge wrote: Reason for CRM: Mitzy, from the [facility] called stating that the pt states she is being seen at another office and that she denied the referral for their office. Please advise.  [phone]

## 2019-11-20 ENCOUNTER — Telehealth: Payer: Self-pay | Admitting: Internal Medicine

## 2019-11-20 NOTE — Telephone Encounter (Signed)
Called pt back told her that we did not have any same day appts today. Told her if her leg pain was really bad and she felt like she couldn't wait until the next appt. Because of the pain that she needed to go to urgent care. Pt wanted to schedule the next available appt. Appt was scheduled. Pt verbalized understanding.  KP

## 2019-11-20 NOTE — Telephone Encounter (Signed)
Copied from CRM [id number]. Topic: General - Other >> Nov 20, 2019 10:57 AM Herby Abraham C wrote: Reason for CRM: pt called in for assistance. Pt says that she is having leg pain and is usually prescribed prednisone. Pt would like to know if provider could send in a Rx for her? Also, pt would like to know if provider could work her in to be seen if possible? (not showing any openings)  Pharmacy:[id number] - [place], [place] - [address] [place] [address] AT [facility] [address] [facility] [address]  Phone:  [phone] Fax:  [phone]

## 2019-11-25 ENCOUNTER — Encounter: Payer: Self-pay | Admitting: Internal Medicine

## 2019-11-25 ENCOUNTER — Other Ambulatory Visit: Payer: Self-pay

## 2019-11-25 ENCOUNTER — Ambulatory Visit (INDEPENDENT_AMBULATORY_CARE_PROVIDER_SITE_OTHER): Payer: Managed Care, Other (non HMO) | Admitting: Internal Medicine

## 2019-11-25 VITALS — BP 112/84 | HR 85 | Temp 98.2°F | Ht 62.0 in | Wt 231.0 lb

## 2019-11-25 DIAGNOSIS — J4520 Mild intermittent asthma, uncomplicated: Secondary | ICD-10-CM | POA: Diagnosis not present

## 2019-11-25 DIAGNOSIS — R600 Localized edema: Secondary | ICD-10-CM

## 2019-11-25 DIAGNOSIS — M79605 Pain in left leg: Secondary | ICD-10-CM

## 2019-11-25 DIAGNOSIS — M1711 Unilateral primary osteoarthritis, right knee: Secondary | ICD-10-CM | POA: Diagnosis not present

## 2019-11-25 DIAGNOSIS — M79604 Pain in right leg: Secondary | ICD-10-CM

## 2019-11-25 MED ORDER — ALBUTEROL SULFATE (2.5 MG/3ML) 0.083% IN NEBU: 2.5000 mg | INHALATION_SOLUTION | Freq: Four times a day (QID) | RESPIRATORY_TRACT | 0 refills | Status: DC | PRN

## 2019-11-25 MED ORDER — IBUPROFEN 800 MG PO TABS: 800.0000 mg | ORAL_TABLET | Freq: Three times a day (TID) | ORAL | 5 refills | Status: DC | PRN

## 2019-11-25 NOTE — Progress Notes (Signed)
Date:  11/25/2019   Name:  Monica Stevenson   DOB:  Jan 30, 1986   MRN:  [id number]   Chief Complaint: Leg Pain (X2 years both legs, more pain in right leg, cramping sharp throbbing pain, calves tight and aching, feet (both) swelling, left lankle pops when walking )  Leg Pain  There was no injury mechanism. The pain is present in the right leg and left leg. The pain is moderate. The pain has been fluctuating since onset. Treatments tried: prednisone. The treatment provided moderate relief.  Knee Pain  There was no injury mechanism. The pain is present in the right knee. The quality of the pain is described as aching. The pain is moderate. The pain has been fluctuating since onset.  Full workup by [facility] - MRI, [place] to rule out DVT and NCS.  MRI normal.  No DVT, unable to see NCS results.  Edema - recurrent sx in both legs and feet.  She uses compression stockings but they often cause more discomfort.  She denies sodium intake.   She is taking a prednisone taper since this is the only thing that relieves her leg pain.  Lab Results  Component Value Date   CREATININE 0.98 01/21/2015   BUN 11 01/21/2015   NA 140 01/21/2015   K 3.2 (L) 01/21/2015   CL 107 01/21/2015   CO2 27 01/21/2015     Review of Systems  Constitutional: Negative for chills and fatigue.  Respiratory: Negative for chest tightness and shortness of breath.   Cardiovascular: Positive for leg swelling. Negative for chest pain and palpitations.  Gastrointestinal: Negative for abdominal pain.  Musculoskeletal: Positive for arthralgias and myalgias.  Neurological: Negative for dizziness and headaches.    Patient Active Problem List   Diagnosis Date Noted  . Encounter for commercial driving license (CDL) exam 49/70/2637  . Mild intermittent asthma without complication 07/02/2018  . 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 Seborrheic dermatitis of scalp 03/06/2017  . Hand discomfort 09/23/2015    Allergies  Allergen  Reactions  . Amoxicillin Rash  . Tomato Other (See Comments)    Past Surgical History:  Procedure Laterality Date  . none      Social History   Tobacco Use  . Smoking status: Never Smoker  . Smokeless tobacco: Never Used  Vaping Use  . Vaping Use: Never used  Substance Use Topics  . Alcohol use: Yes    Alcohol/week: 2.0 standard drinks    Types: 2 Standard drinks or equivalent per week  . Drug use: No     Medication list has been reviewed and updated.  Current Meds  Medication Sig  . albuterol (PROVENTIL) (2.5 MG/3ML) 0.083% nebulizer solution Take 3 mLs (2.5 mg total) by nebulization every 6 (six) hours as needed for wheezing or shortness of breath.  Marland Kitchen albuterol (VENTOLIN HFA) 108 (90 Base) MCG/ACT inhaler INHALE 2 PUFFS INTO THE LUNGS EVERY 6 HOURS AS NEEDED FOR WHEEZING OR SHORTNESS OF BREATH  . ibuprofen (ADVIL) 800 MG tablet TAKE 1 TABLET(800 MG) BY MOUTH EVERY 8 HOURS AS NEEDED  . predniSONE (DELTASONE) 10 MG tablet Take by mouth.  Marland Kitchen tiZANidine (ZANAFLEX) 2 MG tablet Take by mouth.    PHQ 2/9 Scores 11/25/2019 07/02/2018 05/31/2018 01/11/2018  PHQ - 2 Score 2 0 0 1  PHQ- 9 Score 6 - - -    GAD 7 : Generalized Anxiety Score 11/25/2019  Nervous, Anxious, on Edge 0  Control/stop worrying 2  Worry too much -  different things 2  Trouble relaxing 2  Restless 0  Easily annoyed or irritable 0  Afraid - awful might happen 0  Total GAD 7 Score 6  Anxiety Difficulty Not difficult at all    BP Readings from Last 3 Encounters:  11/25/19 112/84  10/28/19 129/65  05/01/19 132/78    Physical Exam Vitals and nursing note reviewed.  Constitutional:      General: She is not in acute distress.    Appearance: She is well-developed.  HENT:     Head: Normocephalic and atraumatic.  Cardiovascular:     Rate and Rhythm: Normal rate and regular rhythm.  Pulmonary:     Effort: Pulmonary effort is normal. No respiratory distress.     Breath sounds: No wheezing or rhonchi.    Musculoskeletal:        General: Normal range of motion.     Right knee: Effusion and crepitus present.     Left knee: Crepitus present. No effusion.     Right lower leg: Edema present.     Left lower leg: Edema present.  Skin:    General: Skin is warm and dry.     Capillary Refill: Capillary refill takes less than 2 seconds.     Findings: No rash.  Neurological:     General: No focal deficit present.     Mental Status: She is alert and oriented to person, place, and time.  Psychiatric:        Behavior: Behavior normal.        Thought Content: Thought content normal.     Wt Readings from Last 3 Encounters:  11/25/19 231 lb (104.8 kg)  10/28/19 224 lb (101.6 kg)  05/01/19 221 lb (100.2 kg)    BP 112/84   Pulse 85   Temp 98.2 F (36.8 C) (Oral)   Ht 5\' 2"  (1.575 m)   Wt 231 lb (104.8 kg)   SpO2 97%   BMI 42.25 kg/m   Assessment and Plan: 1. Localized edema Check labs, limit sodium, continue compression stockings - Ambulatory referral to Vascular Surgery - Comprehensive metabolic panel  2. Bilateral leg pain Ortho work up unrevealing - Ambulatory referral to Vascular Surgery  3. Mild intermittent asthma without complication - albuterol (PROVENTIL) (2.5 MG/3ML) 0.083% nebulizer solution; Take 3 mLs (2.5 mg total) by nebulization every 6 (six) hours as needed for wheezing or shortness of breath.  Dispense: 150 mL; Refill: 0  4. Localized osteoarthritis of right knee Continue prednisone taper; can resume Advil once taper complete Consult Ortho if knee symptoms continue Discussed the benefits of weight loss and programs such as NOOM   Partially dictated using . Any errors are unintentional.  Animal nutritionist, MD [facility]  11/25/2019

## 2019-11-25 NOTE — Patient Instructions (Signed)
Try NOOM for weight loss 

## 2019-11-26 LAB — COMPREHENSIVE METABOLIC PANEL
ALT: 16 IU/L (ref 0–32)
AST: 15 IU/L (ref 0–40)
Albumin/Globulin Ratio: 1.4 (ref 1.2–2.2)
Albumin: 4.1 g/dL (ref 3.8–4.8)
Alkaline Phosphatase: 113 IU/L (ref 48–121)
BUN/Creatinine Ratio: 14 (ref 9–23)
BUN: 12 mg/dL (ref 6–20)
Bilirubin Total: 0.2 mg/dL (ref 0.0–1.2)
CO2: 26 mmol/L (ref 20–29)
Calcium: 8.9 mg/dL (ref 8.7–10.2)
Chloride: 102 mmol/L (ref 96–106)
Creatinine, Ser: 0.86 mg/dL (ref 0.57–1.00)
GFR calc Af Amer: 103 mL/min/{1.73_m2} (ref >59)
GFR calc non Af Amer: 89 mL/min/{1.73_m2} (ref >59)
Globulin, Total: 2.9 g/dL (ref 1.5–4.5)
Glucose: 89 mg/dL (ref 65–99)
Potassium: 4 mmol/L (ref 3.5–5.2)
Sodium: 139 mmol/L (ref 134–144)
Total Protein: 7 g/dL (ref 6.0–8.5)

## 2019-12-23 ENCOUNTER — Encounter: Payer: Self-pay | Admitting: Internal Medicine

## 2019-12-23 ENCOUNTER — Ambulatory Visit (INDEPENDENT_AMBULATORY_CARE_PROVIDER_SITE_OTHER): Payer: Managed Care, Other (non HMO) | Admitting: Internal Medicine

## 2019-12-23 ENCOUNTER — Other Ambulatory Visit: Payer: Self-pay

## 2019-12-23 VITALS — BP 110/78 | HR 81 | Temp 97.9°F | Ht 62.0 in | Wt 222.0 lb

## 2019-12-23 DIAGNOSIS — B354 Tinea corporis: Secondary | ICD-10-CM | POA: Diagnosis not present

## 2019-12-23 MED ORDER — CLOTRIMAZOLE-BETAMETHASONE 1-0.05 % EX CREA: 1.0000 "application " | TOPICAL_CREAM | Freq: Two times a day (BID) | CUTANEOUS | 1 refills | Status: DC

## 2019-12-23 NOTE — Patient Instructions (Signed)
Phone: for [facility] at [facility] [phone]

## 2019-12-23 NOTE — Progress Notes (Signed)
Date:  12/23/2019   Name:  Monica Stevenson   DOB:  [birth year]   MRN:  [id number]   Chief Complaint: Rash (X6 days, had covid 12/08/19 had chills and she was sweating, in between and under breast, lower abdomen, itches, red, painful, )  Rash This is a new problem. The current episode started in the past 7 days. The problem is unchanged. The affected locations include the abdomen and chest. The rash is characterized by itchiness and redness. Associated with: recent Covid infection. Associated symptoms include fatigue. Pertinent negatives include no cough, fever or shortness of breath.    Lab Results  Component Value Date   CREATININE 0.86 11/25/2019   BUN 12 11/25/2019   NA 139 11/25/2019   K 4.0 11/25/2019   CL 102 11/25/2019   CO2 26 11/25/2019   No results found for: CHOL, HDL, LDLCALC, LDLDIRECT, TRIG, CHOLHDL No results found for: TSH No results found for: HGBA1C No results found for: WBC, HGB, HCT, MCV, PLT Lab Results  Component Value Date   ALT 16 11/25/2019   AST 15 11/25/2019   ALKPHOS 113 11/25/2019   BILITOT 0.2 11/25/2019     Review of Systems  Constitutional: Positive for fatigue. Negative for chills and fever.  HENT: Negative for trouble swallowing.        Still has loss of taste and smell  Respiratory: Negative for cough and shortness of breath.   Cardiovascular: Negative for chest pain.  Skin: Positive for rash.    Patient Active Problem List   Diagnosis Date Noted  . Localized osteoarthritis of right knee 11/25/2019  . Localized edema 11/25/2019  . Encounter for commercial driving license (CDL) exam 01/75/1025  . Mild intermittent asthma without complication 07/02/2018  . 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 Seborrheic dermatitis of scalp 03/06/2017  . Hand discomfort 09/23/2015    Allergies  Allergen Reactions  . Amoxicillin Rash  . Tomato Other (See Comments)    Past Surgical History:  Procedure Laterality Date  . none      Social History    Tobacco Use  . Smoking status: Never Smoker  . Smokeless tobacco: Never Used  Vaping Use  . Vaping Use: Never used  Substance Use Topics  . Alcohol use: Yes    Alcohol/week: 2.0 standard drinks    Types: 2 Standard drinks or equivalent per week  . Drug use: No     Medication list has been reviewed and updated.  Current Meds  Medication Sig  . albuterol (VENTOLIN HFA) 108 (90 Base) MCG/ACT inhaler INHALE 2 PUFFS INTO THE LUNGS EVERY 6 HOURS AS NEEDED FOR WHEEZING OR SHORTNESS OF BREATH  . ibuprofen (ADVIL) 800 MG tablet Take 1 tablet (800 mg total) by mouth every 8 (eight) hours as needed.  . predniSONE (DELTASONE) 10 MG tablet Take by mouth.  Marland Kitchen tiZANidine (ZANAFLEX) 2 MG tablet Take by mouth.  . [DISCONTINUED] fluticasone (FLONASE) 50 MCG/ACT nasal spray Place 2 sprays into both nostrils daily.    PHQ 2/9 Scores 11/25/2019 07/02/2018 05/31/2018 01/11/2018  PHQ - 2 Score 2 0 0 1  PHQ- 9 Score 6 - - -    GAD 7 : Generalized Anxiety Score 11/25/2019  Nervous, Anxious, on Edge 0  Control/stop worrying 2  Worry too much - different things 2  Trouble relaxing 2  Restless 0  Easily annoyed or irritable 0  Afraid - awful might happen 0  Total GAD 7 Score 6  Anxiety Difficulty Not difficult  at all    BP Readings from Last 3 Encounters:  12/23/19 110/78  11/25/19 112/84  10/28/19 129/65    Physical Exam Vitals and nursing note reviewed.  Constitutional:      General: She is not in acute distress.    Appearance: She is well-developed.  HENT:     Head: Normocephalic and atraumatic.  Pulmonary:     Effort: Pulmonary effort is normal. No respiratory distress.  Musculoskeletal:        General: Normal range of motion.  Skin:    General: Skin is warm and dry.     Findings: No rash.          Comments: Scattered macular lesions with mild scaling; no tenderness or drainage c/w tinea  Neurological:     Mental Status: She is alert and oriented to person, place, and time.    Psychiatric:        Behavior: Behavior normal.        Thought Content: Thought content normal.     Wt Readings from Last 3 Encounters:  12/23/19 222 lb (100.7 kg)  11/25/19 231 lb (104.8 kg)  10/28/19 224 lb (101.6 kg)    BP 110/78   Pulse 81   Temp 97.9 F (36.6 C) (Oral)   Ht 5\' 2"  (1.575 m)   Wt 222 lb (100.7 kg)   SpO2 97%   BMI 40.60 kg/m   Assessment and Plan: 1. Tinea corporis Vs rash secondary Covid infection Local care advise given - clotrimazole-betamethasone (LOTRISONE) cream; Apply 1 application topically 2 (two) times daily.  Dispense: 30 g; Refill: 1   Partially dictated using . Any errors are unintentional.  Animal nutritionist, MD [facility]  12/23/2019

## 2020-07-23 ENCOUNTER — Telehealth: Payer: Self-pay

## 2020-07-23 ENCOUNTER — Other Ambulatory Visit: Payer: Self-pay | Admitting: Internal Medicine

## 2020-07-23 DIAGNOSIS — B354 Tinea corporis: Secondary | ICD-10-CM

## 2020-07-23 MED ORDER — FLUCONAZOLE 100 MG PO TABS
100.0000 mg | ORAL_TABLET | Freq: Once | ORAL | 0 refills | Status: AC
Start: 2020-07-23 — End: 2020-07-23

## 2020-07-23 MED ORDER — CLOTRIMAZOLE-BETAMETHASONE 1-0.05 % EX CREA: 1.0000 "application " | TOPICAL_CREAM | Freq: Two times a day (BID) | CUTANEOUS | 0 refills | Status: DC

## 2020-07-23 NOTE — Telephone Encounter (Signed)
Copied from CRM [id number]. Topic: General - Inquiry >> Jul 23, 2020  3:52 PM Daphine Deutscher D wrote: Reason for CRM: Pt called saying she is [place] right now working and needs to see if Dr. Otho Darner can send a prescription for yeast under her breast  topical and oral.  [facility]  [address] [place]  [address]   [phone]  CB#  [phone]

## 2020-07-23 NOTE — Telephone Encounter (Signed)
Done

## 2020-07-23 NOTE — Telephone Encounter (Signed)
Please review. Last office visit was 12/22/2020.  KP

## 2020-07-23 NOTE — Telephone Encounter (Signed)
Called pt could not leave VM phone was busy. Please let pt know that medication was [facility] to [facility] in [place].  Will route result note to [facility] Nurse Triage for follow up when patient returns call to clinic. Nurse may give results to patient if they return call. CRM created for this message.   KP

## 2020-09-07 ENCOUNTER — Ambulatory Visit (INDEPENDENT_AMBULATORY_CARE_PROVIDER_SITE_OTHER): Payer: 59 | Admitting: Internal Medicine

## 2020-09-07 ENCOUNTER — Other Ambulatory Visit: Payer: Self-pay

## 2020-09-07 ENCOUNTER — Encounter: Payer: Self-pay | Admitting: Internal Medicine

## 2020-09-07 VITALS — BP 110/82 | HR 90 | Temp 98.3°F | Ht 62.0 in | Wt 222.0 lb

## 2020-09-07 DIAGNOSIS — R600 Localized edema: Secondary | ICD-10-CM | POA: Diagnosis not present

## 2020-09-07 DIAGNOSIS — M1711 Unilateral primary osteoarthritis, right knee: Secondary | ICD-10-CM

## 2020-09-07 DIAGNOSIS — J0100 Acute maxillary sinusitis, unspecified: Secondary | ICD-10-CM

## 2020-09-07 MED ORDER — AZITHROMYCIN 250 MG PO TABS: ORAL_TABLET | ORAL | 0 refills | Status: AC

## 2020-09-07 MED ORDER — FLUTICASONE PROPIONATE 50 MCG/ACT NA SUSP: 2.0000 | Freq: Every day | NASAL | 6 refills | Status: DC

## 2020-09-07 NOTE — Patient Instructions (Signed)
Monica Stevenson, [place] (Attending) [phone] (Work) [phone] (Fax) [address] [place], [place] [address] Orthopedic Surgery

## 2020-09-07 NOTE — Progress Notes (Signed)
Date:  09/07/2020   Name:  Monica Stevenson   DOB:  1985/06/25   MRN:  [id number]   Chief Complaint: Sinusitis (X2-3 days, pain behind eyes, OTC meds has not worked, congested, sneezing, neck and shoulders cramping )  Sinusitis This is a new problem. The current episode started in the past 7 days. The problem has been gradually worsening since onset. There has been no fever. The pain is mild. Associated symptoms include congestion, headaches, neck pain, sinus pressure and sneezing. Pertinent negatives include no chills, coughing or shortness of breath. Past treatments include oral decongestants. The treatment provided mild relief.  Knee Pain  The pain is present in the right knee and left knee. The pain is mild. The pain has been fluctuating since onset. She has tried NSAIDs and acetaminophen for the symptoms. The treatment provided mild relief.  Edema - wears compression hose most of the time but the swelling returns.  She has decided to come off the road for a few weeks.  She has pain in the right leg and foot mostly. She is wondering if she should see a podiatrist.  She was referred last year to [facility] but caught covid and had to cancel.  Lab Results  Component Value Date   CREATININE 0.86 11/25/2019   BUN 12 11/25/2019   NA 139 11/25/2019   K 4.0 11/25/2019   CL 102 11/25/2019   CO2 26 11/25/2019   No results found for: CHOL, HDL, LDLCALC, LDLDIRECT, TRIG, CHOLHDL No results found for: TSH No results found for: HGBA1C No results found for: WBC, HGB, HCT, MCV, PLT Lab Results  Component Value Date   ALT 16 11/25/2019   AST 15 11/25/2019   ALKPHOS 113 11/25/2019   BILITOT 0.2 11/25/2019     Review of Systems  Constitutional: Negative for chills, fatigue and fever.  HENT: Positive for congestion, postnasal drip, sinus pressure and sneezing.   Respiratory: Negative for cough, chest tightness, shortness of breath and wheezing.   Cardiovascular: Positive for leg swelling. Negative  for chest pain and palpitations.  Musculoskeletal: Positive for arthralgias, gait problem and neck pain.  Neurological: Positive for headaches.    Patient Active Problem List   Diagnosis Date Noted  . Localized osteoarthritis of right knee 11/25/2019  . Localized edema 11/25/2019  . Encounter for commercial driving license (CDL) exam 62/94/7654  . Mild intermittent asthma without complication 07/02/2018  . 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 Seborrheic dermatitis of scalp 03/06/2017  . Hand discomfort 09/23/2015    Allergies  Allergen Reactions  . Amoxicillin Rash  . Tomato Other (See Comments)    Past Surgical History:  Procedure Laterality Date  . none      Social History   Tobacco Use  . Smoking status: Never Smoker  . Smokeless tobacco: Never Used  Vaping Use  . Vaping Use: Never used  Substance Use Topics  . Alcohol use: Yes    Alcohol/week: 2.0 standard drinks    Types: 2 Standard drinks or equivalent per week  . Drug use: No     Medication list has been reviewed and updated.  Current Meds  Medication Sig  . albuterol (PROVENTIL) (2.5 MG/3ML) 0.083% nebulizer solution Take 3 mLs (2.5 mg total) by nebulization every 6 (six) hours as needed for wheezing or shortness of breath.  Marland Kitchen albuterol (VENTOLIN HFA) 108 (90 Base) MCG/ACT inhaler INHALE 2 PUFFS INTO THE LUNGS EVERY 6 HOURS AS NEEDED FOR WHEEZING OR SHORTNESS OF BREATH  .  clotrimazole-betamethasone (LOTRISONE) cream Apply 1 application topically 2 (two) times daily. (Patient taking differently: Apply 1 application topically 2 (two) times daily. For rash under breast)  . ibuprofen (ADVIL) 800 MG tablet Take 1 tablet (800 mg total) by mouth every 8 (eight) hours as needed.    PHQ 2/9 Scores 09/07/2020 11/25/2019 07/02/2018 05/31/2018  PHQ - 2 Score 1 2 0 0  PHQ- 9 Score 3 6 - -    GAD 7 : Generalized Anxiety Score 09/07/2020 11/25/2019  Nervous, Anxious, on Edge 0 0  Control/stop worrying 1 2  Worry too much -  different things 1 2  Trouble relaxing 0 2  Restless 0 0  Easily annoyed or irritable 0 0  Afraid - awful might happen 0 0  Total GAD 7 Score 2 6  Anxiety Difficulty - Not difficult at all    BP Readings from Last 3 Encounters:  09/07/20 110/82  12/23/19 110/78  11/25/19 112/84    Physical Exam Constitutional:      Appearance: She is well-developed.  HENT:     Right Ear: Ear canal and external ear normal. Tympanic membrane is not erythematous or retracted.     Left Ear: Ear canal and external ear normal. Tympanic membrane is not erythematous or retracted.     Nose:     Right Sinus: Maxillary sinus tenderness and frontal sinus tenderness present.     Left Sinus: Maxillary sinus tenderness and frontal sinus tenderness present.     Mouth/Throat:     Mouth: No oral lesions.     Pharynx: Uvula midline. Posterior oropharyngeal erythema present. No oropharyngeal exudate.  Cardiovascular:     Rate and Rhythm: Normal rate and regular rhythm.     Pulses: Normal pulses.     Heart sounds: Normal heart sounds.  Pulmonary:     Breath sounds: Normal breath sounds. No wheezing or rales.  Musculoskeletal:     Cervical back: Normal range of motion.     Right knee: Bony tenderness present. No effusion or erythema.     Left knee: Bony tenderness present. No effusion or erythema.     Right lower leg: Edema (trace) present.     Left lower leg: Edema (trace) present.  Lymphadenopathy:     Cervical: No cervical adenopathy.  Neurological:     Mental Status: She is alert and oriented to person, place, and time.     Wt Readings from Last 3 Encounters:  09/07/20 222 lb (100.7 kg)  12/23/19 222 lb (100.7 kg)  11/25/19 231 lb (104.8 kg)    BP 110/82   Pulse 90   Temp 98.3 F (36.8 C) (Oral)   Ht 5\' 2"  (1.575 m)   Wt 222 lb (100.7 kg)   SpO2 98%   BMI 40.60 kg/m   Assessment and Plan: 1. Acute non-recurrent maxillary sinusitis Continue sudfed; add flonase - azithromycin (ZITHROMAX  Z-PAK) 250 MG tablet; UAD  Dispense: 6 each; Refill: 0 - fluticasone (FLONASE) 50 MCG/ACT nasal spray; Place 2 sprays into both nostrils daily.  Dispense: 16 g; Refill: 6  2. Localized osteoarthritis of right knee Recommend follow up with [facility]  3. Localized edema - Ambulatory referral to Vascular Surgery   Partially dictated using Dragon software. Any errors are unintentional.  , MD [facility]  09/07/2020

## 2020-09-08 ENCOUNTER — Other Ambulatory Visit: Payer: Self-pay | Admitting: Internal Medicine

## 2020-09-08 DIAGNOSIS — J4520 Mild intermittent asthma, uncomplicated: Secondary | ICD-10-CM

## 2020-09-08 NOTE — Telephone Encounter (Signed)
Requested medications are due for refill today.  yes  Requested medications are on the active medications list.  yes  Last refill. 07/09/2019  Future visit scheduled.   no  Notes to clinic.  Rx is expired.

## 2021-01-24 ENCOUNTER — Ambulatory Visit: Payer: 59 | Admitting: Internal Medicine

## 2021-01-24 ENCOUNTER — Other Ambulatory Visit: Payer: Self-pay

## 2021-01-24 ENCOUNTER — Encounter: Payer: Self-pay | Admitting: Internal Medicine

## 2021-01-24 ENCOUNTER — Ambulatory Visit (INDEPENDENT_AMBULATORY_CARE_PROVIDER_SITE_OTHER): Payer: Self-pay | Admitting: Internal Medicine

## 2021-01-24 ENCOUNTER — Ambulatory Visit: Payer: Self-pay | Admitting: *Deleted

## 2021-01-24 VITALS — BP 122/72 | HR 89 | Ht 62.0 in | Wt 225.0 lb

## 2021-01-24 DIAGNOSIS — S300XXA Contusion of lower back and pelvis, initial encounter: Secondary | ICD-10-CM

## 2021-01-24 DIAGNOSIS — Z6841 Body Mass Index (BMI) 40.0 and over, adult: Secondary | ICD-10-CM

## 2021-01-24 NOTE — Telephone Encounter (Signed)
Pt reports fell from last step of truck on 01/14/21. States swelling and pain buttocks "Left side, top of buttocks area." States has been trying to treat herself but not resolving. Intensity varies 9/10 at times.     Reason for Disposition  [1] Fall AND [2] patient seems very anxious and fearful of falling again  Answer Assessment - Initial Assessment Questions 1. MECHANISM: "How did the fall happen?"     Larey Seat out of truck, last step Friday 01/14/21 2. DOMESTIC VIOLENCE AND ELDER ABUSE SCREENING: "Did you fall because someone pushed you or tried to hurt you?" If Yes, ask: "Are you safe now?"     no 3. ONSET: "When did the fall happen?" (e.g., minutes, hours, or days ago)     Tripped over last step exiting truck 4. LOCATION: "What part of the body hit the ground?" (e.g., back, buttocks, head, hips, knees, hands, head, stomach)     Buttocks,top of left side 5. INJURY: "Did you hurt (injure) yourself when you fell?" If Yes, ask: "What did you injure? Tell me more about this?" (e.g., body area; type of injury; pain severity)"     yes 6. PAIN: "Is there any pain?" If Yes, ask: "How bad is the pain?" (e.g., Scale 1-10; or mild,  moderate, severe)   - NONE (0): No pain   - MILD (1-3): Doesn't interfere with normal activities    - MODERATE (4-7): Interferes with normal activities or awakens from sleep    - SEVERE (8-10): Excruciating pain, unable to do any normal activities      9/10 at times 7. SIZE: For cuts, bruises, or swelling, ask: "How large is it?" (e.g., inches or centimeters)      Swelling 8. PREGNANCY: "Is there any chance you are pregnant?" "When was your last menstrual period?"     no 9. OTHER SYMPTOMS: "Do you have any other symptoms?" (e.g., dizziness, fever, weakness; new onset or worsening).      no 10. CAUSE: "What do you think caused the fall (or falling)?" (e.g., tripped, dizzy spell)       tripped  Protocols used: Falls and [facility]

## 2021-01-24 NOTE — Telephone Encounter (Signed)
Noted  KP 

## 2021-01-24 NOTE — Patient Instructions (Signed)
Use a warm compresses to the buttock to encourage the blood to absorb.

## 2021-01-24 NOTE — Telephone Encounter (Signed)
Agent made appt for 1040 today prior to triage. No lacerations. Advised pt to keep appt.

## 2021-01-24 NOTE — Progress Notes (Signed)
Date:  01/24/2021   Name:  Monica Stevenson   DOB:  10/07/1985   MRN:  [id number]   Chief Complaint: Fall Patient here today with her 34 year old nephew- Major. Fall The accident occurred More than 1 week ago. The fall occurred while jumping from a height. She landed on Concrete. There was no blood loss. The point of impact was the buttocks. The pain is present in the buttocks and back. The pain is at a severity of 5/10. The pain is moderate. The symptoms are aggravated by sitting and pressure on injury. Pertinent negatives include no abdominal pain, fever or headaches. Patient fell while working in [place] at [employer]. 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 The date of injury was 01/14/2021.  Lab Results  Component Value Date   CREATININE 0.86 11/25/2019   BUN 12 11/25/2019   NA 139 11/25/2019   K 4.0 11/25/2019   CL 102 11/25/2019   CO2 26 11/25/2019   No results found for: CHOL, HDL, LDLCALC, LDLDIRECT, TRIG, CHOLHDL No results found for: TSH No results found for: HGBA1C No results found for: WBC, HGB, HCT, MCV, PLT Lab Results  Component Value Date   ALT 16 11/25/2019   AST 15 11/25/2019   ALKPHOS 113 11/25/2019   BILITOT 0.2 11/25/2019     Review of Systems  Constitutional:  Negative for chills and fever.  Gastrointestinal:  Negative for abdominal pain, constipation and diarrhea.  Musculoskeletal:  Positive for myalgias (left buttock tender and bruised). Negative for arthralgias.  Skin:  Positive for color change.  Neurological:  Negative for dizziness and headaches.  Psychiatric/Behavioral:  Negative for dysphoric mood and sleep disturbance. The patient is not nervous/anxious.    Patient Active Problem List   Diagnosis Date Noted  . Localized osteoarthritis of right knee 11/25/2019  . Localized edema 11/25/2019  . Encounter for commercial driving license (CDL) exam 33/82/5053  . Patellofemoral pain  syndrome of right knee 03/31/2019  . Chronic bilateral low back pain with right-sided sciatica 03/31/2019  . Mild intermittent asthma without complication 07/02/2018  . 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 Seborrheic dermatitis of scalp 03/06/2017  . Hand discomfort 09/23/2015    Allergies  Allergen Reactions  . Amoxicillin Rash  . Tomato Other (See Comments)    Past Surgical History:  Procedure Laterality Date  . none      Social History   Tobacco Use  . Smoking status: Never  . Smokeless tobacco: Never  Vaping Use  . Vaping Use: Never used  Substance Use Topics  . Alcohol use: Yes    Alcohol/week: 2.0 standard drinks    Types: 2 Standard drinks or equivalent per week  . Drug use: No     Medication list has been reviewed and updated.  Current Meds  Medication Sig  . albuterol (PROVENTIL) (2.5 MG/3ML) 0.083% nebulizer solution Take 3 mLs (2.5 mg total) by nebulization every 6 (six) hours as needed for wheezing or shortness of breath.  Marland Kitchen albuterol (VENTOLIN HFA) 108 (90 Base) MCG/ACT inhaler INHALE 2 PUFFS INTO THE LUNGS EVERY 6 HOURS AS NEEDED FOR WHEEZING OR SHORTNESS OF BREATH  . clotrimazole-betamethasone (LOTRISONE) cream Apply 1 application topically 2 (two) times daily. (Patient taking differently: Apply 1 application topically 2 (two) times daily. For rash under breast)  . etonogestrel-ethinyl estradiol (NUVARING) 0.12-0.015 MG/24HR vaginal ring Place vaginally.  . fluticasone (FLONASE) 50 MCG/ACT nasal spray  Place 2 sprays into both nostrils daily.  Marland Kitchen ibuprofen (ADVIL) 800 MG tablet Take 1 tablet (800 mg total) by mouth every 8 (eight) hours as needed.    PHQ 2/9 Scores 01/24/2021 09/07/2020 11/25/2019 07/02/2018  PHQ - 2 Score 1 1 2  0  PHQ- 9 Score 1 3 6  -    GAD 7 : Generalized Anxiety Score 01/24/2021 09/07/2020 11/25/2019  Nervous, Anxious, on Edge 0 0 0  Control/stop worrying 0 1 2  Worry too much - different things 0 1 2  Trouble relaxing 0 0 2  Restless 0  0 0  Easily annoyed or irritable 0 0 0  Afraid - awful might happen 0 0 0  Total GAD 7 Score 0 2 6  Anxiety Difficulty Not difficult at all - Not difficult at all    BP Readings from Last 3 Encounters:  01/24/21 122/72  09/07/20 110/82  12/23/19 110/78    Physical Exam Vitals and nursing note reviewed.  Constitutional:      General: She is not in acute distress.    Appearance: She is well-developed. She is obese.  HENT:     Head: Normocephalic and atraumatic.  Cardiovascular:     Rate and Rhythm: Normal rate and regular rhythm.     Pulses: Normal pulses.  Pulmonary:     Effort: Pulmonary effort is normal. No respiratory distress.     Breath sounds: No wheezing or rhonchi.  Musculoskeletal:     Cervical back: Normal range of motion.     Lumbar back: Normal. Negative right straight leg raise test and negative left straight leg raise test.     Right hip: No tenderness. Normal range of motion.     Left hip: No tenderness. Normal range of motion.  Lymphadenopathy:     Cervical: No cervical adenopathy.  Skin:    General: Skin is warm and dry.     Findings: Bruising present. No rash.          Comments: Slightly warm and tender  Neurological:     Mental Status: She is alert and oriented to person, place, and time.  Psychiatric:        Mood and Affect: Mood normal.        Behavior: Behavior normal.    Wt Readings from Last 3 Encounters:  01/24/21 225 lb (102.1 kg)  09/07/20 222 lb (100.7 kg)  12/23/19 222 lb (100.7 kg)    BP 122/72 (BP Location: Left Arm, Patient Position: Sitting, Cuff Size: Large)   Pulse 89   Ht 5\' 2"  (1.575 m)   Wt 225 lb (102.1 kg)   SpO2 97%   BMI 41.15 kg/m   Assessment and Plan: 1. Contusion, buttock, initial encounter Patient is reassured - it will take time to resolve the hematoma but there should not be any long term adverse effects Use heating pad on low and take Advil as needed  2. Class 3 severe obesity without serious comorbidity  with body mass index (BMI) of 40.0 to 44.9 in adult, unspecified obesity type [facility]) Apparently she was previously prescribed Wegovy from [facility] but I can not see who She is interested in resuming medication. I recommend follow up with Bariatrics for recommendations since [facility] is on back order.   Partially dictated using [facility]. Any errors are unintentional.  [facility], MD [facility]  01/24/2021

## 2021-04-01 ENCOUNTER — Telehealth: Payer: Self-pay

## 2021-04-01 ENCOUNTER — Other Ambulatory Visit: Payer: Self-pay

## 2021-04-01 ENCOUNTER — Ambulatory Visit (INDEPENDENT_AMBULATORY_CARE_PROVIDER_SITE_OTHER): Payer: Self-pay | Admitting: Internal Medicine

## 2021-04-01 ENCOUNTER — Encounter: Payer: Self-pay | Admitting: Internal Medicine

## 2021-04-01 ENCOUNTER — Other Ambulatory Visit: Payer: Self-pay | Admitting: Internal Medicine

## 2021-04-01 VITALS — BP 122/78 | HR 79 | Ht 62.0 in | Wt 225.0 lb

## 2021-04-01 DIAGNOSIS — R0609 Other forms of dyspnea: Secondary | ICD-10-CM

## 2021-04-01 DIAGNOSIS — J4520 Mild intermittent asthma, uncomplicated: Secondary | ICD-10-CM

## 2021-04-01 DIAGNOSIS — R0789 Other chest pain: Secondary | ICD-10-CM

## 2021-04-01 DIAGNOSIS — B354 Tinea corporis: Secondary | ICD-10-CM

## 2021-04-01 MED ORDER — BUDESONIDE-FORMOTEROL FUMARATE 80-4.5 MCG/ACT IN AERO: 2.0000 | INHALATION_SPRAY | Freq: Two times a day (BID) | RESPIRATORY_TRACT | 3 refills | Status: DC

## 2021-04-01 MED ORDER — ALBUTEROL SULFATE HFA 108 (90 BASE) MCG/ACT IN AERS: 2.0000 | INHALATION_SPRAY | Freq: Four times a day (QID) | RESPIRATORY_TRACT | 1 refills | Status: DC | PRN

## 2021-04-01 MED ORDER — CLOTRIMAZOLE-BETAMETHASONE 1-0.05 % EX CREA: 1.0000 "application " | TOPICAL_CREAM | Freq: Two times a day (BID) | CUTANEOUS | 2 refills | Status: DC

## 2021-04-01 NOTE — Telephone Encounter (Signed)
Requested medications are due for refill today yes  Requested medications are on the active medication list yes  Last refill 09/02/20  Last visit 04/01/21  Future visit scheduled 06/03/21  Notes to clinic Failed protocol of labs within 360 days, has upcoming appt, please assess. Requested Prescriptions  Pending Prescriptions Disp Refills   ibuprofen (ADVIL) 800 MG tablet [Pharmacy Med Name: IBUPROFEN 800MG  TABLETS] 90 tablet 5    Sig: TAKE 1 TABLET(800 MG) BY MOUTH EVERY 8 HOURS AS NEEDED     Analgesics:  NSAIDS Failed - 04/01/2021  1:53 PM      Failed - Cr in normal range and within 360 days    Creatinine, Ser  Date Value Ref Range Status  11/25/2019 0.86 0.57 - 1.00 mg/dL Final          Failed - HGB in normal range and within 360 days    No results found for: HGB, HGBKUC, HGBPOCKUC, HGBOTHER, TOTHGB, HGBPLASMA        Passed - Patient is not pregnant      Passed - Valid encounter within last 12 months    Recent Outpatient Visits           Today Chest tightness   [facility], MD   2 months ago Contusion, buttock, initial encounter   [facility], MD   6 months ago Acute non-recurrent maxillary sinusitis   [facility], MD   1 year ago Tinea corporis   [facility], MD   1 year ago Localized edema   [facility], MD       Future Appointments             In 2 months Reubin Milan Judithann Graves, MD [facility], [facility]

## 2021-04-01 NOTE — Telephone Encounter (Signed)
Patient seen today. Unsure why she could not get through as we have been getting a calls from other patients.

## 2021-04-01 NOTE — Telephone Encounter (Signed)
Copied from CRM [id number]. Topic: General - Other >> Mar 31, 2021  3:29 PM Elliot Gault wrote: Patient states she has been calling all week the [phone] and receives a message reflecting the practice # is not in service. Patient had to call the ED and be transferred to the practice.

## 2021-04-01 NOTE — Progress Notes (Signed)
Date:  04/01/2021   Name:  Monica Stevenson   DOB:  28-Jan-1986   MRN:  [id number]   Chief Complaint: Chest Pain  Chest Pain  This is a new problem. The current episode started more than 1 month ago. The problem occurs intermittently. The problem has been unchanged. The pain is present in the substernal region. The pain is mild. The quality of the pain is described as tightness. The pain does not radiate. Associated symptoms include exertional chest pressure, irregular heartbeat, malaise/fatigue and shortness of breath. Pertinent negatives include no abdominal pain, cough, dizziness, fever, headaches or palpitations. Associated symptoms comments: [place], and black spots in vision. The pain is aggravated by exertion. Treatments tried: albuterol MDI. The treatment provided moderate relief. Risk factors include sedentary lifestyle, obesity and lack of exercise.  Asthma She complains of shortness of breath and wheezing. There is no cough. This is a recurrent problem. The problem occurs daily. The problem has been gradually worsening. Associated symptoms include chest pain and malaise/fatigue. Pertinent negatives include no fever or headaches. Her symptoms are alleviated by anxiolytics. Her past medical history is significant for asthma.  Rash This is a recurrent problem. The affected locations include the groin and torso. Associated symptoms include shortness of breath. Pertinent negatives include no cough, fatigue or fever. Past treatments include topical steroids. Her past medical history is significant for asthma.   Lab Results  Component Value Date   CREATININE 0.86 11/25/2019   BUN 12 11/25/2019   NA 139 11/25/2019   K 4.0 11/25/2019   CL 102 11/25/2019   CO2 26 11/25/2019   No results found for: CHOL, HDL, LDLCALC, LDLDIRECT, TRIG, CHOLHDL No results found for: TSH No results found for: HGBA1C No results found for: WBC, HGB, HCT, MCV, PLT Lab Results  Component Value Date   ALT 16  11/25/2019   AST 15 11/25/2019   ALKPHOS 113 11/25/2019   BILITOT 0.2 11/25/2019   No components found for: VITD  Review of Systems  Constitutional:  Positive for malaise/fatigue. Negative for chills, fatigue and fever.  Eyes:  Negative for visual disturbance.  Respiratory:  Positive for chest tightness, shortness of breath and wheezing. Negative for cough.   Cardiovascular:  Positive for chest pain. Negative for palpitations and leg swelling.  Gastrointestinal:  Negative for abdominal pain.  Skin:  Positive for rash.  Neurological:  Negative for dizziness and headaches.  Psychiatric/Behavioral:  Negative for dysphoric mood and sleep disturbance. The patient is not nervous/anxious.    Patient Active Problem List   Diagnosis Date Noted   Localized osteoarthritis of right knee 11/25/2019   Localized edema 11/25/2019   Encounter for commercial driving license (CDL) exam 48/18/5631   Patellofemoral pain syndrome of right knee 03/31/2019   Chronic bilateral low back pain with right-sided sciatica 03/31/2019   Mild intermittent asthma without complication 07/02/2018   Functional diarrhea 07/02/2018   Seborrheic dermatitis of scalp 03/06/2017   Hand discomfort 09/23/2015    Allergies  Allergen Reactions   Amoxicillin Rash   Tomato Other (See Comments)    Past Surgical History:  Procedure Laterality Date   none      Social History   Tobacco Use   Smoking status: Never   Smokeless tobacco: Never  Vaping Use   Vaping Use: Never used  Substance Use Topics   Alcohol use: Yes    Alcohol/week: 2.0 standard drinks    Types: 2 Standard drinks or equivalent per week   Drug  use: No     Medication list has been reviewed and updated.  Current Meds  Medication Sig   albuterol (PROVENTIL) (2.5 MG/3ML) 0.083% nebulizer solution Take 3 mLs (2.5 mg total) by nebulization every 6 (six) hours as needed for wheezing or shortness of breath.   albuterol (VENTOLIN HFA) 108 (90 Base)  MCG/ACT inhaler INHALE 2 PUFFS INTO THE LUNGS EVERY 6 HOURS AS NEEDED FOR WHEEZING OR SHORTNESS OF BREATH   etonogestrel-ethinyl estradiol (NUVARING) 0.12-0.015 MG/24HR vaginal ring Place vaginally.   fluticasone (FLONASE) 50 MCG/ACT nasal spray Place 2 sprays into both nostrils daily.   ibuprofen (ADVIL) 800 MG tablet Take 1 tablet (800 mg total) by mouth every 8 (eight) hours as needed.   [DISCONTINUED] clotrimazole-betamethasone (LOTRISONE) cream Apply 1 application topically 2 (two) times daily. (Patient taking differently: Apply 1 application topically 2 (two) times daily. For rash under breast)    PHQ 2/9 Scores 04/01/2021 04/01/2021 01/24/2021 09/07/2020  PHQ - 2 Score 2 0 1 1  PHQ- 9 Score 8 0 1 3    GAD 7 : Generalized Anxiety Score 04/01/2021 04/01/2021 01/24/2021 09/07/2020  Nervous, Anxious, on Edge 0 0 0 0  Control/stop worrying 0 0 0 1  Worry too much - different things 0 0 0 1  Trouble relaxing 0 0 0 0  Restless 0 0 0 0  Easily annoyed or irritable 0 0 0 0  Afraid - awful might happen 0 0 0 0  Total GAD 7 Score 0 0 0 2  Anxiety Difficulty Not difficult at all Not difficult at all Not difficult at all -    BP Readings from Last 3 Encounters:  04/01/21 122/78  01/24/21 122/72  09/07/20 110/82    Physical Exam Vitals and nursing note reviewed.  Constitutional:      General: She is not in acute distress.    Appearance: She is well-developed.  HENT:     Head: Normocephalic and atraumatic.  Neck:     Thyroid: No thyroid mass.     Vascular: No JVD.  Cardiovascular:     Rate and Rhythm: Normal rate and regular rhythm. No extrasystoles are present.    Heart sounds: Normal heart sounds.  No diastolic murmur is present.    No friction rub.  Pulmonary:     Effort: Pulmonary effort is normal. No respiratory distress.     Breath sounds: Normal breath sounds. No wheezing or rhonchi.  Chest:     Chest wall: No mass or edema.  Abdominal:     General: Bowel sounds are  normal.     Palpations: Abdomen is soft.  Musculoskeletal:        General: Normal range of motion.     Cervical back: Normal range of motion.     Right lower leg: No tenderness.     Left lower leg: No tenderness.  Skin:    General: Skin is warm and dry.     Findings: No rash.  Neurological:     Mental Status: She is alert and oriented to person, place, and time.  Psychiatric:        Mood and Affect: Mood normal.        Behavior: Behavior normal.    Wt Readings from Last 3 Encounters:  04/01/21 225 lb (102.1 kg)  01/24/21 225 lb (102.1 kg)  09/07/20 222 lb (100.7 kg)    BP 122/78   Pulse 79   Ht 5\' 2"  (1.575 m)   Wt 225 lb (102.1  kg)   SpO2 98%   BMI 41.15 kg/m   Assessment and Plan: 1. DOE (dyspnea on exertion) Most likely a combination of obesity, lack of exercise and asthma.  2. Chest tightness Fairly consistently related to exercise and shortness of breath and relieved by albuterol MDI - EKG 12-Lead - SR @ 70, low voltage  3. Tinea corporis - clotrimazole-betamethasone (LOTRISONE) cream; Apply 1 application topically 2 (two) times daily. For rash under breast  Dispense: 15 g; Refill: 2  4. Mild intermittent asthma without complication Recommend daily controlled with Symbicort Recommend using albuterol 20 minutes prior to exercise Follow up response to the above in 2 months - budesonide-formoterol (SYMBICORT) 80-4.5 MCG/ACT inhaler; Inhale 2 puffs into the lungs 2 (two) times daily.  Dispense: 1 each; Refill: 3 - albuterol (VENTOLIN HFA) 108 (90 Base) MCG/ACT inhaler; Inhale 2 puffs into the lungs every 6 (six) hours as needed for wheezing or shortness of breath.  Dispense: 18 g; Refill: 1   Partially dictated using Animal nutritionist. Any errors are unintentional.  Bari Edward, MD [facility]  04/01/2021

## 2021-06-02 ENCOUNTER — Telehealth: Payer: Self-pay

## 2021-06-02 NOTE — Telephone Encounter (Signed)
Spoke to pt she wanted to know what her appointment was for. Told pt it was for a asthma follow up. Pt stated she had things going on and had to go to a funeral and wanted to reschedule. Rescheduled pts appointment.  KP

## 2021-06-02 NOTE — Telephone Encounter (Signed)
Patient ask that chassidy give her a call about her appointment tomorrow.

## 2021-06-03 ENCOUNTER — Ambulatory Visit: Payer: Self-pay | Admitting: Internal Medicine

## 2021-06-09 ENCOUNTER — Other Ambulatory Visit: Payer: Self-pay

## 2021-06-09 ENCOUNTER — Telehealth: Payer: Self-pay | Admitting: Internal Medicine

## 2021-06-09 DIAGNOSIS — G8929 Other chronic pain: Secondary | ICD-10-CM

## 2021-06-09 DIAGNOSIS — M542 Cervicalgia: Secondary | ICD-10-CM

## 2021-06-09 MED ORDER — CYCLOBENZAPRINE HCL 10 MG PO TABS: 10.0000 mg | ORAL_TABLET | Freq: Every day | ORAL | 3 refills | Status: DC

## 2021-06-09 NOTE — Telephone Encounter (Signed)
Copied from CRM [id number]. 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

## 2021-06-17 ENCOUNTER — Ambulatory Visit: Payer: Self-pay | Admitting: Internal Medicine

## 2021-06-20 ENCOUNTER — Ambulatory Visit: Payer: Self-pay | Admitting: Internal Medicine

## 2021-06-20 NOTE — Telephone Encounter (Signed)
Patient scheduled for a visit today.

## 2021-06-20 NOTE — Progress Notes (Deleted)
° ° °  Date:  06/20/2021   Name:  Monica Stevenson   DOB:  February 17, 1986   MRN:  [id number]   Chief Complaint: No chief complaint on file.  Asthma Her past medical history is significant for asthma.   Lab Results  Component Value Date   NA 139 11/25/2019   K 4.0 11/25/2019   CO2 26 11/25/2019   GLUCOSE 89 11/25/2019   BUN 12 11/25/2019   CREATININE 0.86 11/25/2019   CALCIUM 8.9 11/25/2019   GFRNONAA 89 11/25/2019   No results found for: CHOL, HDL, LDLCALC, LDLDIRECT, TRIG, CHOLHDL No results found for: TSH No results found for: HGBA1C No results found for: WBC, HGB, HCT, MCV, PLT Lab Results  Component Value Date   ALT 16 11/25/2019   AST 15 11/25/2019   ALKPHOS 113 11/25/2019   BILITOT 0.2 11/25/2019   No results found for: Janith Lima, VD25OH   Review of Systems  Patient Active Problem List   Diagnosis Date Noted   Localized osteoarthritis of right knee 11/25/2019   Localized edema 11/25/2019   Encounter for commercial driving license (CDL) exam 68/34/1962   Patellofemoral pain syndrome of right knee 03/31/2019   Chronic bilateral low back pain with right-sided sciatica 03/31/2019   Mild intermittent asthma without complication 07/02/2018   Functional diarrhea 07/02/2018   Seborrheic dermatitis of scalp 03/06/2017   Hand discomfort 09/23/2015    Allergies  Allergen Reactions   Amoxicillin Rash   Tomato Other (See Comments)    Past Surgical History:  Procedure Laterality Date   none      Social History   Tobacco Use   Smoking status: Never   Smokeless tobacco: Never  Vaping Use   Vaping Use: Never used  Substance Use Topics   Alcohol use: Yes    Alcohol/week: 2.0 standard drinks    Types: 2 Standard drinks or equivalent per week   Drug use: No     Medication list has been reviewed and updated.  No outpatient medications have been marked as taking for the 06/20/21 encounter (Appointment) with Reubin Milan, MD.    [facility] 2/9 Scores  04/01/2021 04/01/2021 01/24/2021 09/07/2020  PHQ - 2 Score 2 0 1 1  PHQ- 9 Score 8 0 1 3    GAD 7 : Generalized Anxiety Score 04/01/2021 04/01/2021 01/24/2021 09/07/2020  Nervous, Anxious, on Edge 0 0 0 0  Control/stop worrying 0 0 0 1  Worry too much - different things 0 0 0 1  Trouble relaxing 0 0 0 0  Restless 0 0 0 0  Easily annoyed or irritable 0 0 0 0  Afraid - awful might happen 0 0 0 0  Total GAD 7 Score 0 0 0 2  Anxiety Difficulty Not difficult at all Not difficult at all Not difficult at all -    BP Readings from Last 3 Encounters:  04/01/21 122/78  01/24/21 122/72  09/07/20 110/82    Physical Exam  Wt Readings from Last 3 Encounters:  04/01/21 225 lb (102.1 kg)  01/24/21 225 lb (102.1 kg)  09/07/20 222 lb (100.7 kg)    There were no vitals taken for this visit.  Assessment and Plan:

## 2021-08-05 IMAGING — CR DG KNEE COMPLETE 4+V*R*
5 series · 5 of 5 positions shown · non-contrast
Comparison: None.

CLINICAL DATA: Right knee pain after injury.

EXAM:
RIGHT KNEE - COMPLETE 4+ VIEW

[knee ap (1 of 2)]
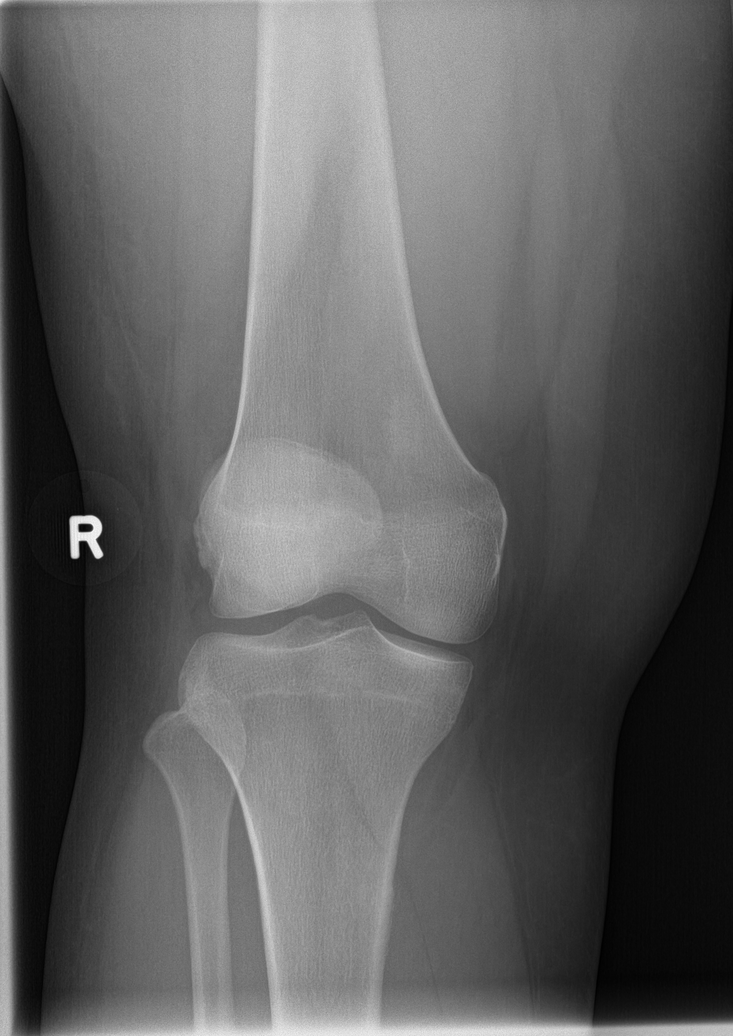

[knee lat]
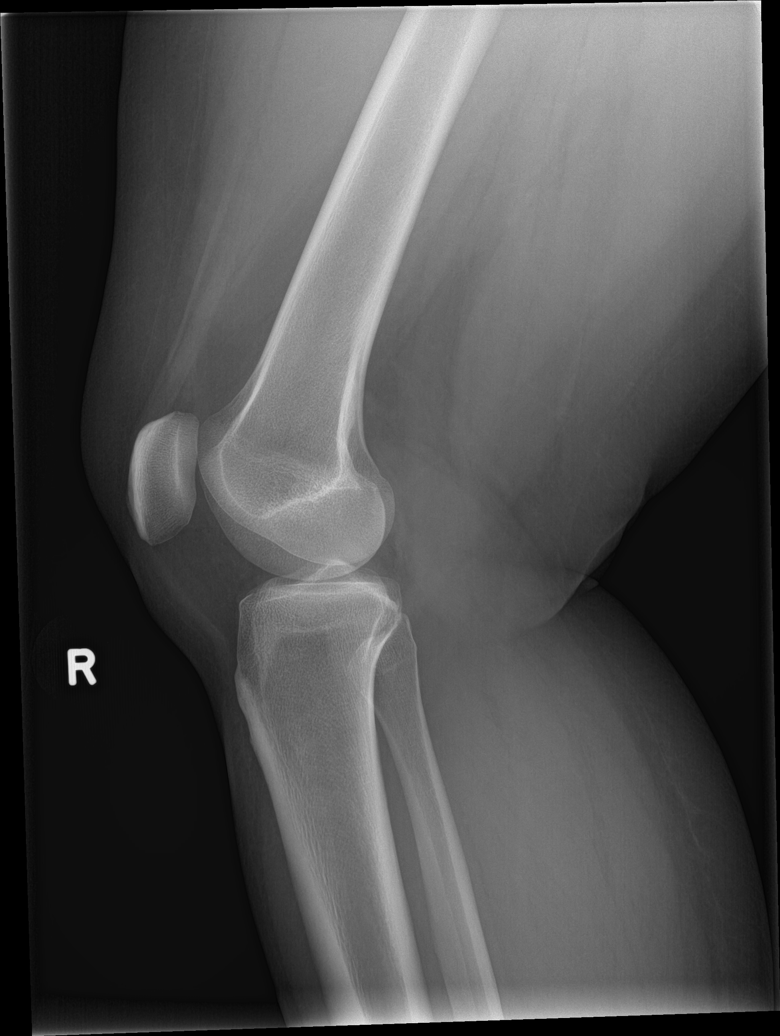

[tunnel]
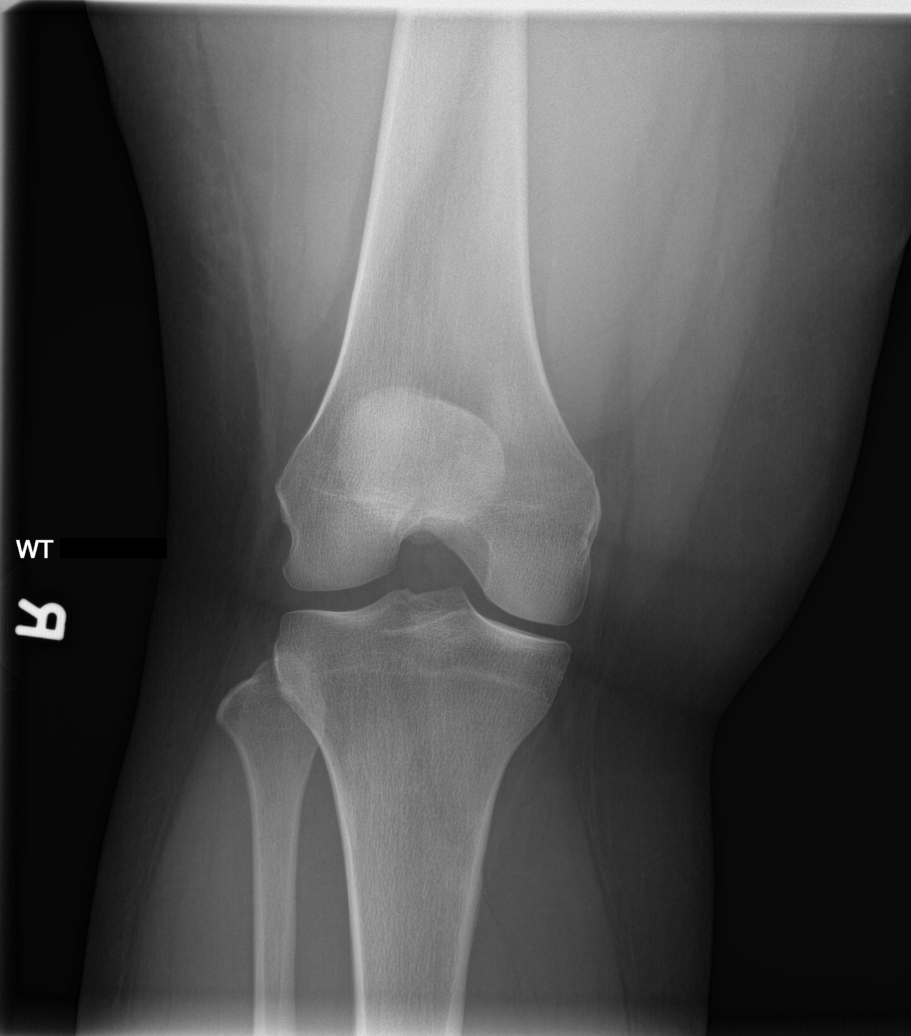

[patella skyline]
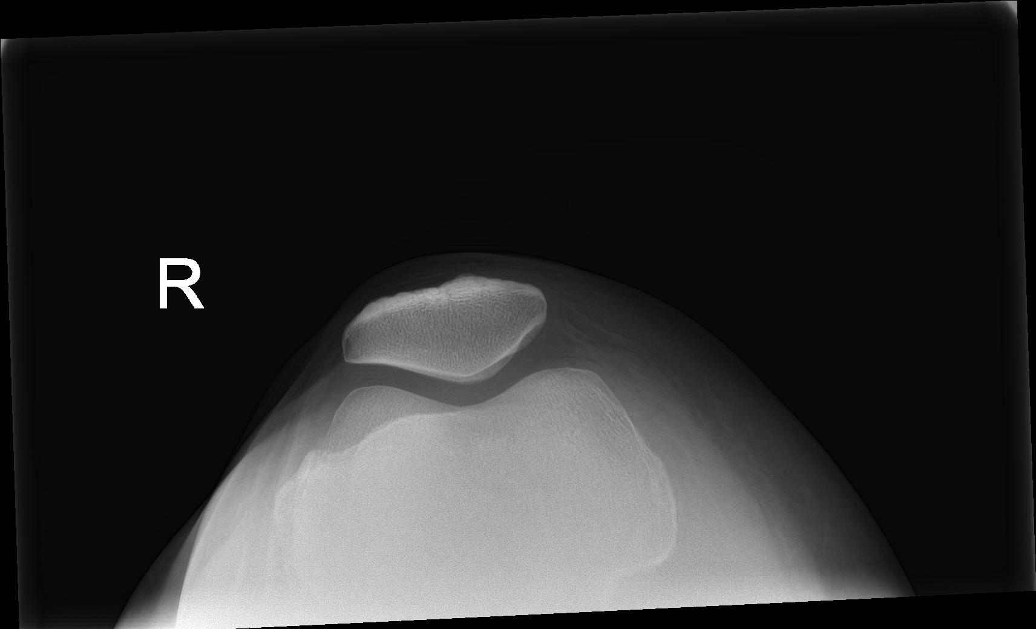

[knee ap (2 of 2)]
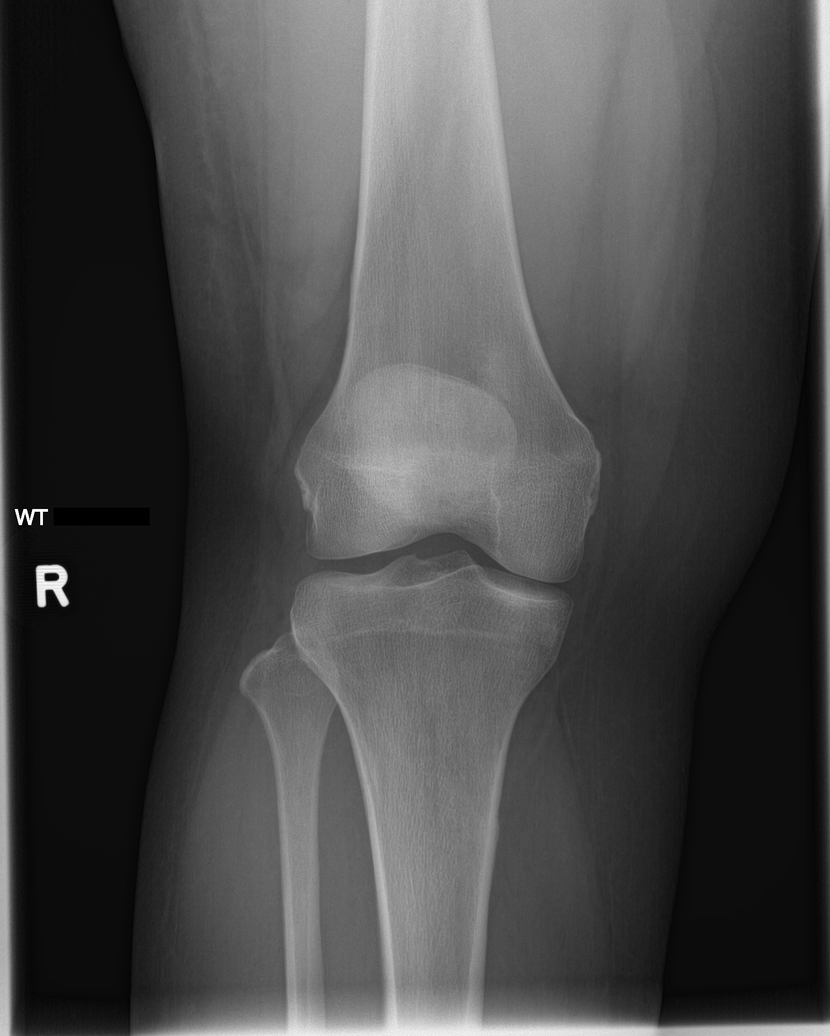

[5 of 5 positions shown; findings below may reference images not displayed]

FINDINGS: No evidence of fracture, dislocation, or joint effusion. No evidence
of arthropathy or other focal bone abnormality. Soft tissues are
unremarkable.
IMPRESSION: Negative.

## 2021-10-07 ENCOUNTER — Other Ambulatory Visit: Payer: Self-pay

## 2021-10-07 ENCOUNTER — Ambulatory Visit: Payer: Self-pay | Admitting: Internal Medicine

## 2021-10-07 DIAGNOSIS — J0100 Acute maxillary sinusitis, unspecified: Secondary | ICD-10-CM

## 2021-10-07 MED ORDER — FLUTICASONE PROPIONATE 50 MCG/ACT NA SUSP: 2.0000 | Freq: Every day | NASAL | 1 refills | Status: DC

## 2021-10-07 MED ORDER — IBUPROFEN 800 MG PO TABS: ORAL_TABLET | ORAL | 0 refills | Status: DC

## 2021-11-25 ENCOUNTER — Telehealth: Payer: Self-pay | Admitting: Internal Medicine

## 2021-11-25 NOTE — Telephone Encounter (Signed)
Copied from CRM [id number]. Topic: General - Other >> Nov 25, 2021  4:15 PM Everette C wrote: Reason for CRM: The patient would like to speak with a member of clinical staff about their chart and previous records of their health information   The patient has recently had issues with their life insurance and blood sugar / diabetic concerns within the past 10 years   Please contact further when possible

## 2021-11-28 NOTE — Telephone Encounter (Signed)
Spoke to pt let her know that we don't have that she is a diabetic or that she has ever been treated for DM or HTN in our chart. Told pt to try to do the process over again or try another life insurance company. Told pt that we do not have any blood work indicating that she has DM or preDM. Pt verbalized understanding.  KP

## 2022-01-31 LAB — HM PAP SMEAR: HM Pap smear: NORMAL

## 2022-01-31 LAB — RESULTS CONSOLE HPV: CHL HPV: POSITIVE

## 2022-07-29 ENCOUNTER — Other Ambulatory Visit: Payer: Self-pay | Admitting: Internal Medicine

## 2022-07-29 DIAGNOSIS — B354 Tinea corporis: Secondary | ICD-10-CM

## 2022-07-31 NOTE — Telephone Encounter (Signed)
Requested medication (s) are due for refill today: Yes  Requested medication (s) are on the active medication list: Yes  Last refill:  04/01/21  Future visit scheduled: No  Notes to clinic:  Unable to reach pt. To make appointment.    Requested Prescriptions  Pending Prescriptions Disp Refills   clotrimazole-betamethasone (LOTRISONE) cream [Pharmacy Med Name: CLOTRIMAZOLE-BETAMETHASONE CRM 15GM] 15 g 2    Sig: APPLY TOPICALLY TO THE AFFECTED AREA TWICE DAILY FOR RASH UNDER BREAST     Off-Protocol Failed - 07/29/2022  3:28 PM      Failed - Medication not assigned to a protocol, review manually.      Failed - Valid encounter within last 12 months    Recent Outpatient Visits           1 year ago Chest tightness   [facility], Jesse Sans, MD   1 year ago Contusion, buttock, initial encounter   [place] [facility], Jesse Sans, MD   1 year ago Acute non-recurrent maxillary sinusitis   [facility], Jesse Sans, MD   2 years ago Tinea corporis   [place] [facility], Jesse Sans, MD   2 years ago Localized edema   [facility] [place] [facility], Jesse Sans, MD

## 2022-08-22 ENCOUNTER — Telehealth: Payer: Self-pay | Admitting: Internal Medicine

## 2022-08-22 ENCOUNTER — Encounter: Payer: Self-pay | Admitting: Internal Medicine

## 2022-08-22 ENCOUNTER — Ambulatory Visit (INDEPENDENT_AMBULATORY_CARE_PROVIDER_SITE_OTHER): Payer: Self-pay | Admitting: Internal Medicine

## 2022-08-22 VITALS — BP 120/62 | HR 88 | Ht 62.0 in | Wt 222.0 lb

## 2022-08-22 DIAGNOSIS — B354 Tinea corporis: Secondary | ICD-10-CM

## 2022-08-22 DIAGNOSIS — J4520 Mild intermittent asthma, uncomplicated: Secondary | ICD-10-CM

## 2022-08-22 DIAGNOSIS — J0100 Acute maxillary sinusitis, unspecified: Secondary | ICD-10-CM

## 2022-08-22 DIAGNOSIS — R87619 Unspecified abnormal cytological findings in specimens from cervix uteri: Secondary | ICD-10-CM | POA: Insufficient documentation

## 2022-08-22 DIAGNOSIS — R198 Other specified symptoms and signs involving the digestive system and abdomen: Secondary | ICD-10-CM

## 2022-08-22 MED ORDER — IBUPROFEN 800 MG PO TABS: ORAL_TABLET | ORAL | 1 refills | Status: DC

## 2022-08-22 MED ORDER — AMOXICILLIN-POT CLAVULANATE 875-125 MG PO TABS: 1.0000 | ORAL_TABLET | Freq: Two times a day (BID) | ORAL | 0 refills | Status: AC

## 2022-08-22 MED ORDER — CLOTRIMAZOLE-BETAMETHASONE 1-0.05 % EX CREA: 1.0000 | TOPICAL_CREAM | Freq: Two times a day (BID) | CUTANEOUS | 2 refills | Status: DC

## 2022-08-22 MED ORDER — ALBUTEROL SULFATE HFA 108 (90 BASE) MCG/ACT IN AERS: 2.0000 | INHALATION_SPRAY | Freq: Four times a day (QID) | RESPIRATORY_TRACT | 1 refills | Status: DC | PRN

## 2022-08-22 NOTE — Assessment & Plan Note (Signed)
Mild intermittent symptoms relieved by PRN albuterol.

## 2022-08-22 NOTE — Telephone Encounter (Signed)
Patient could not remember the name of constipation medicine Dr Judithann Graves told her to try. Informed her it was miralax.   Jesson Foskey

## 2022-08-22 NOTE — Telephone Encounter (Signed)
Copied from CRM [id number]. Topic: General - Other >> Aug 22, 2022 11:08 AM Monica Stevenson A wrote: Reason for CRM: Pt is calling to speak with Chassidy. Pt states she was just seen in the office and went to the Pharmacy to pick up her prescriptions and the pharmacy is stating that one of the prescriptions could be over the counter. Pt states that she does not remember what that medication is and would like for Chassidy to call her back to discuss.

## 2022-08-22 NOTE — Progress Notes (Signed)
Date:  08/22/2022   Name:  Monica Stevenson   DOB:  [birth year]   MRN:  [id number]   Chief Complaint: Change in Stool (Constipation. Last BM was this morning. Noticed stool is dark green or black per patient. Last time this happened she had an infection and she wants to discuss this. Nauseous.) and Sinus Problem (Sinus pressure, and headache and right ear pain. Possible sinus infection per patient. )  Sinus Problem This is a new problem. The current episode started 1 to 4 weeks ago. There has been no fever. Associated symptoms include congestion and sinus pressure. Pertinent negatives include no chills, shortness of breath or sore throat.  Rash This is a recurrent problem. The affected locations include the chest. The rash is characterized by redness and itchiness. Associated symptoms include congestion. Pertinent negatives include no fatigue, fever, shortness of breath or sore throat.  Constipation This is a new problem. The current episode started 1 to 4 weeks ago. The problem has been gradually improving since onset. Her stool frequency is 1 time per day. The stool is described as formed and malodorous. The patient is not on a high fiber diet. She Does not exercise regularly. There has Been adequate water intake. Pertinent negatives include no abdominal pain, fever, nausea or rectal pain. She has tried diet changes (probiotics) for the symptoms. The treatment provided mild relief.    Lab Results  Component Value Date   NA 139 11/25/2019   K 4.0 11/25/2019   CO2 26 11/25/2019   GLUCOSE 89 11/25/2019   BUN 12 11/25/2019   CREATININE 0.86 11/25/2019   CALCIUM 8.9 11/25/2019   GFRNONAA 89 11/25/2019   No results found for: "CHOL", "HDL", "LDLCALC", "LDLDIRECT", "TRIG", "CHOLHDL" No results found for: "TSH" No results found for: "HGBA1C" No results found for: "WBC", "HGB", "HCT", "MCV", "PLT" Lab Results  Component Value Date   ALT 16 11/25/2019   AST 15 11/25/2019   ALKPHOS 113  11/25/2019   BILITOT 0.2 11/25/2019   No results found for: "25OHVITD2", "25OHVITD3", "VD25OH"   Review of Systems  Constitutional:  Negative for chills, fatigue and fever.  HENT:  Positive for congestion, postnasal drip and sinus pressure. Negative for sore throat and trouble swallowing.   Respiratory:  Negative for chest tightness and shortness of breath.   Cardiovascular:  Negative for chest pain and leg swelling.  Gastrointestinal:  Positive for constipation. Negative for abdominal pain, blood in stool, nausea and rectal pain.  Skin:  Positive for rash.  Psychiatric/Behavioral:  Negative for dysphoric mood and sleep disturbance. The patient is not nervous/anxious.     Patient Active Problem List   Diagnosis Date Noted   Abnormal Pap smear of cervix 08/22/2022   Localized osteoarthritis of right knee 11/25/2019   Localized edema 11/25/2019   Encounter for commercial driving license (CDL) exam 40/98/119106/15/2021   Patellofemoral pain syndrome of right knee 03/31/2019   Chronic bilateral low back pain with right-sided sciatica 03/31/2019   Mild intermittent asthma without complication 07/02/2018   Functional diarrhea 07/02/2018   Seborrheic dermatitis of scalp 03/06/2017   Hand discomfort 09/23/2015    Allergies  Allergen Reactions   Tomato Other (See Comments)    Past Surgical History:  Procedure Laterality Date   none      Social History   Tobacco Use   Smoking status: Never   Smokeless tobacco: Never  Vaping Use   Vaping Use: Never used  Substance Use Topics   Alcohol use:  Yes    Alcohol/week: 2.0 standard drinks of alcohol    Types: 2 Standard drinks or equivalent per week   Drug use: No     Medication list has been reviewed and updated.  Current Meds  Medication Sig   albuterol (PROVENTIL) (2.5 MG/3ML) 0.083% nebulizer solution Take 3 mLs (2.5 mg total) by nebulization every 6 (six) hours as needed for wheezing or shortness of breath.   amoxicillin-clavulanate  (AUGMENTIN) 875-125 MG tablet Take 1 tablet by mouth 2 (two) times daily for 10 days.   budesonide-formoterol (SYMBICORT) 80-4.5 MCG/ACT inhaler Inhale 2 puffs into the lungs 2 (two) times daily.   cyclobenzaprine (FLEXERIL) 10 MG tablet Take 1 tablet (10 mg total) by mouth at bedtime. Allow 8 hours to elapse before operating a motor vehicle.   etonogestrel-ethinyl estradiol (NUVARING) 0.12-0.015 MG/24HR vaginal ring Place vaginally.   fluticasone (FLONASE) 50 MCG/ACT nasal spray Place 2 sprays into both nostrils daily.   [DISCONTINUED] albuterol (VENTOLIN HFA) 108 (90 Base) MCG/ACT inhaler Inhale 2 puffs into the lungs every 6 (six) hours as needed for wheezing or shortness of breath.   [DISCONTINUED] clotrimazole-betamethasone (LOTRISONE) cream Apply 1 application topically 2 (two) times daily. For rash under breast   [DISCONTINUED] ibuprofen (ADVIL) 800 MG tablet TAKE 1 TABLET(800 MG) BY MOUTH EVERY 8 HOURS AS NEEDED       08/22/2022   10:18 AM 04/01/2021    9:16 AM 04/01/2021    9:12 AM 01/24/2021    4:06 PM  GAD 7 : Generalized Anxiety Score  Nervous, Anxious, on Edge 0 0 0 0  Control/stop worrying 0 0 0 0  Worry too much - different things 0 0 0 0  Trouble relaxing 0 0 0 0  Restless 0 0 0 0  Easily annoyed or irritable 0 0 0 0  Afraid - awful might happen 0 0 0 0  Total GAD 7 Score 0 0 0 0  Anxiety Difficulty Not difficult at all Not difficult at all Not difficult at all Not difficult at all       08/22/2022   10:18 AM 04/01/2021    9:16 AM 04/01/2021    9:12 AM  Depression screen PHQ 2/9  Decreased Interest 0 2 0  Down, Depressed, Hopeless 0 0 0  PHQ - 2 Score 0 2 0  Altered sleeping 0 3 0  Tired, decreased energy 0 3 0  Change in appetite 0 0 0  Feeling bad or failure about yourself  0 0 0  Trouble concentrating 0 0 0  Moving slowly or fidgety/restless 0 0 0  Suicidal thoughts 0 0 0  PHQ-9 Score 0 8 0  Difficult doing work/chores Not difficult at all Not difficult at  all Not difficult at all    BP Readings from Last 3 Encounters:  08/22/22 120/62  04/01/21 122/78  01/24/21 122/72    Physical Exam Constitutional:      Appearance: Normal appearance. She is well-developed.  HENT:     Right Ear: Ear canal and external ear normal. Tympanic membrane is not erythematous or retracted.     Left Ear: Ear canal and external ear normal. Tympanic membrane is not erythematous or retracted.     Nose:     Right Sinus: Maxillary sinus tenderness present. No frontal sinus tenderness.     Left Sinus: Maxillary sinus tenderness present. No frontal sinus tenderness.     Mouth/Throat:     Mouth: No oral lesions.     Pharynx:  Uvula midline. No oropharyngeal exudate or posterior oropharyngeal erythema.  Cardiovascular:     Rate and Rhythm: Normal rate and regular rhythm.     Heart sounds: Normal heart sounds.  Pulmonary:     Breath sounds: Normal breath sounds. No wheezing or rales.  Abdominal:     General: Abdomen is protuberant. Bowel sounds are normal.     Palpations: Abdomen is soft.     Tenderness: There is no abdominal tenderness. There is no right CVA tenderness, left CVA tenderness, guarding or rebound.  Musculoskeletal:     Cervical back: Normal range of motion.  Lymphadenopathy:     Cervical: No cervical adenopathy.  Neurological:     Mental Status: She is alert and oriented to person, place, and time.     Wt Readings from Last 3 Encounters:  08/22/22 222 lb (100.7 kg)  04/01/21 225 lb (102.1 kg)  01/24/21 225 lb (102.1 kg)    BP 120/62   Pulse 88   Ht 5\' 2"  (1.575 m)   Wt 222 lb (100.7 kg)   SpO2 98%   BMI 40.60 kg/m   Assessment and Plan:  Problem List Items Addressed This Visit       Respiratory   Mild intermittent asthma without complication    Mild intermittent symptoms relieved by PRN albuterol.      Relevant Medications   albuterol (VENTOLIN HFA) 108 (90 Base) MCG/ACT inhaler   Other Visit Diagnoses     Acute  non-recurrent maxillary sinusitis    -  Primary   continue fluids, advil for HA/sinus pain follow up if needed   Relevant Medications   amoxicillin-clavulanate (AUGMENTIN) 875-125 MG tablet   Tinea corporis       Relevant Medications   clotrimazole-betamethasone (LOTRISONE) cream   Irregular bowel habits       mild constipation continue probiotics; add Miralax and adjust dose to desired effect No evidence of colitis or other pathology by exam or hx       Return if symptoms worsen or fail to improve.   Partially dictated using Dragon software, any errors are not intentional.  Reubin Milan, MD [facility] [place], [place]

## 2022-08-22 NOTE — Patient Instructions (Signed)
Try Miralax powder daily for constipation.

## 2022-11-23 ENCOUNTER — Other Ambulatory Visit: Payer: Self-pay

## 2022-11-23 DIAGNOSIS — M5412 Radiculopathy, cervical region: Secondary | ICD-10-CM | POA: Insufficient documentation

## 2022-11-23 DIAGNOSIS — J45909 Unspecified asthma, uncomplicated: Secondary | ICD-10-CM | POA: Insufficient documentation

## 2022-11-23 DIAGNOSIS — R079 Chest pain, unspecified: Secondary | ICD-10-CM | POA: Insufficient documentation

## 2022-11-23 DIAGNOSIS — Z7951 Long term (current) use of inhaled steroids: Secondary | ICD-10-CM | POA: Insufficient documentation

## 2022-11-23 NOTE — ED Triage Notes (Signed)
Pt to ED via POV c/o right sided shoulder pain that radiates to center of chest, up right side of neck, and down right side of back. Reports pain is "sharp, throbbing, and aching all at once" PT says its started 2-3wks ago and thought she had pulled a muscle but [ain hasn't stopped. Denies SOB, Nausea, fevers, dizziness

## 2022-11-24 ENCOUNTER — Emergency Department: Payer: Self-pay

## 2022-11-24 ENCOUNTER — Emergency Department
Admission: EM | Admit: 2022-11-24 | Discharge: 2022-11-24 | Disposition: A | Discharge status: Home / Self Care | Payer: Self-pay | Attending: Emergency Medicine | Admitting: Emergency Medicine

## 2022-11-24 ENCOUNTER — Ambulatory Visit: Payer: Self-pay | Admitting: Family Medicine

## 2022-11-24 DIAGNOSIS — E876 Hypokalemia: Secondary | ICD-10-CM

## 2022-11-24 DIAGNOSIS — M5412 Radiculopathy, cervical region: Secondary | ICD-10-CM

## 2022-11-24 LAB — CBC
HCT: 32.6 % — ABNORMAL LOW (ref 36.0–46.0)
Hemoglobin: 9.7 g/dL — ABNORMAL LOW (ref 12.0–15.0)
MCH: 22.7 pg — ABNORMAL LOW (ref 26.0–34.0)
MCHC: 29.8 g/dL — ABNORMAL LOW (ref 30.0–36.0)
MCV: 76.2 fL — ABNORMAL LOW (ref 80.0–100.0)
Platelets: 418 10*3/uL — ABNORMAL HIGH (ref 150–400)
RBC: 4.28 MIL/uL (ref 3.87–5.11)
RDW: 15.3 % (ref 11.5–15.5)
WBC: 7.4 10*3/uL (ref 4.0–10.5)
nRBC: 0 % (ref 0.0–0.2)

## 2022-11-24 LAB — BASIC METABOLIC PANEL
Anion gap: 9 (ref 5–15)
BUN: 9 mg/dL (ref 6–20)
CO2: 22 mmol/L (ref 22–32)
Calcium: 8.6 mg/dL — ABNORMAL LOW (ref 8.9–10.3)
Chloride: 105 mmol/L (ref 98–111)
Creatinine, Ser: 0.9 mg/dL (ref 0.44–1.00)
GFR, Estimated: >60 mL/min (ref >60)
Glucose, Bld: 208 mg/dL — ABNORMAL HIGH (ref 70–99)
Potassium: 3.3 mmol/L — ABNORMAL LOW (ref 3.5–5.1)
Sodium: 136 mmol/L (ref 135–145)

## 2022-11-24 LAB — TROPONIN I (HIGH SENSITIVITY)
Troponin I (High Sensitivity): <2 ng/L (ref <18)
Troponin I (High Sensitivity): <2 ng/L (ref <18)

## 2022-11-24 LAB — D-DIMER, QUANTITATIVE: D-Dimer, Quant: <0.27 ug/mL-FEU (ref 0.00–0.50)

## 2022-11-24 MED ORDER — KETOROLAC TROMETHAMINE 30 MG/ML IJ SOLN
15.0000 mg | Freq: Once | INTRAMUSCULAR | Status: AC
  Administered 2022-11-24: 15 mg via INTRAVENOUS
  Filled 2022-11-24: qty 1

## 2022-11-24 MED ORDER — POTASSIUM CHLORIDE CRYS ER 20 MEQ PO TBCR
40.0000 meq | EXTENDED_RELEASE_TABLET | Freq: Once | ORAL | Status: AC
  Administered 2022-11-24: 40 meq via ORAL
  Filled 2022-11-24: qty 2

## 2022-11-24 MED ORDER — NAPROXEN 500 MG PO TABS: 500.0000 mg | ORAL_TABLET | Freq: Two times a day (BID) | ORAL | 0 refills | Status: DC

## 2022-11-24 MED ORDER — CYCLOBENZAPRINE HCL 5 MG PO TABS: ORAL_TABLET | ORAL | 0 refills | Status: DC

## 2022-11-24 MED ORDER — LIDOCAINE 5 % EX PTCH: 1.0000 | MEDICATED_PATCH | CUTANEOUS | 0 refills | Status: DC

## 2022-11-24 MED ORDER — LIDOCAINE 5 % EX PTCH
1.0000 | MEDICATED_PATCH | CUTANEOUS | Status: DC
  Administered 2022-11-24: 1 via TRANSDERMAL
  Filled 2022-11-24: qty 1

## 2022-11-24 MED ORDER — HYDROCODONE-ACETAMINOPHEN 5-325 MG PO TABS: 1.0000 | ORAL_TABLET | Freq: Four times a day (QID) | ORAL | 0 refills | Status: DC | PRN

## 2022-11-24 MED ORDER — METHYLPREDNISOLONE 4 MG PO TBPK: ORAL_TABLET | ORAL | 0 refills | Status: DC

## 2022-11-24 NOTE — ED Notes (Signed)
Discharge instructions per edp were discussed with pt. Pt provided with work note, printed prescriptions, and AVS. Pt verbalized understanding with no additional questions at this time.

## 2022-11-24 NOTE — ED Notes (Signed)
Patient transported to X-ray 
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
## 2022-11-24 NOTE — Discharge Instructions (Signed)
1.  Take Naprosyn twice daily and use Lidocaine patch while you are driving. 2.  Take steroid taper as prescribed. 3.  Take Norco and Flexeril only when you are not driving for pain and muscle spasms. 4.  Return to the ER for worsening symptoms, persistent vomiting, difficulty breathing or other concerns.

## 2022-11-27 ENCOUNTER — Telehealth: Payer: Self-pay

## 2022-11-27 NOTE — Transitions of Care (Post Inpatient/ED Visit) (Signed)
   11/27/2022  Name: Monica Stevenson MRN: [id number] DOB: [birth year]  Today's TOC FU Call Status: Today's TOC FU Call Status:: Successful TOC FU Call Competed TOC FU Call Complete Date: 11/27/22  Transition Care Management Follow-up Telephone Call Date of Discharge: 11/24/22 Discharge Facility[facility]) Type of Discharge: Emergency Department Reason for ED Visit: Other: (Cervical Radiculopathy) How have you been since you were released from the hospital?: Better Any questions or concerns?: No  Items Reviewed: Did you receive and understand the discharge instructions provided?: Yes Medications obtained,verified, and reconciled?: Yes (Medications Reviewed) Any new allergies since your discharge?: No Dietary orders reviewed?: No Do you have support at home?: No  Medications Reviewed Today: Medications Reviewed Today     Reviewed by Mariel Sleet, CMA (Certified Medical Assistant) on 08/22/22 at 1013  Med List Status: <None>   Medication Order Taking? Sig Documenting Provider Last Dose Status Informant  albuterol (PROVENTIL) (2.5 MG/3ML) 0.083% nebulizer solution [id number] Yes Take 3 mLs (2.5 mg total) by nebulization every 6 (six) hours as needed for wheezing or shortness of breath. Reubin Milan, MD Taking Active   albuterol (VENTOLIN HFA) 108 [phone] Base) MCG/ACT inhaler [id number] Yes Inhale 2 puffs into the lungs every 6 (six) hours as needed for wheezing or shortness of breath. Reubin Milan, MD Taking Active   budesonide-formoterol [facility]) 80-4.5 MCG/ACT inhaler [id number] Yes Inhale 2 puffs into the lungs 2 (two) times daily. Reubin Milan, MD Taking Active   clotrimazole-betamethasone [facility]) cream [id number] Yes Apply 1 application topically 2 (two) times daily. For rash under breast Reubin Milan, MD Taking Active   cyclobenzaprine (FLEXERIL) 10 MG tablet [id number] Yes Take 1 tablet (10 mg total) by mouth at bedtime. Allow 8  hours to elapse before operating a motor vehicle. Reubin Milan, MD Taking Active   etonogestrel-ethinyl estradiol (NUVARING) 0.12-0.015 MG/24HR vaginal ring [id number] Yes Place vaginally. [provider] Taking Active   fluticasone (FLONASE) 50 MCG/ACT nasal spray [id number] Yes Place 2 sprays into both nostrils daily. Reubin Milan, MD Taking Active   ibuprofen (ADVIL) 800 MG tablet [id number] Yes TAKE 1 TABLET(800 MG) BY MOUTH EVERY 8 HOURS AS NEEDED Reubin Milan, MD Taking Active     Discontinued 04/26/19 1040             Home Care and Equipment/Supplies: Were Home Health Services Ordered?: No Any new equipment or medical supplies ordered?: No  Functional Questionnaire: Do you need assistance with bathing/showering or dressing?: No Do you need assistance with meal preparation?: No Do you need assistance with eating?: No Do you have difficulty maintaining continence: No Do you need assistance with getting out of bed/getting out of a chair/moving?: No Do you have difficulty managing or taking your medications?: No  Follow up appointments reviewed: PCP Follow-up appointment confirmed?: No MD Provider Line [phone] Given: Yes Do you need transportation to your follow-up appointment?: No Do you understand care options if your condition(s) worsen?: Yes-patient verbalized understanding    [facility]  Primary Care & Sports Medicine at [facility] CMA, AAMA [address]  [place] [address] Office [phone]  Fax: [phone]

## 2023-03-01 ENCOUNTER — Ambulatory Visit: Payer: Self-pay
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
## 2023-03-02 ENCOUNTER — Encounter: Payer: Self-pay | Admitting: Internal Medicine

## 2023-03-02 ENCOUNTER — Ambulatory Visit (INDEPENDENT_AMBULATORY_CARE_PROVIDER_SITE_OTHER): Payer: Self-pay | Admitting: Internal Medicine

## 2023-03-02 ENCOUNTER — Ambulatory Visit: Payer: Self-pay | Admitting: Internal Medicine

## 2023-03-02 VITALS — BP 128/78 | HR 92 | Ht 62.0 in | Wt 223.0 lb

## 2023-03-02 DIAGNOSIS — M533 Sacrococcygeal disorders, not elsewhere classified: Secondary | ICD-10-CM

## 2023-03-02 DIAGNOSIS — M62830 Muscle spasm of back: Secondary | ICD-10-CM

## 2023-03-02 MED ORDER — LIDOCAINE 5 % EX PTCH
3.0000 | MEDICATED_PATCH | CUTANEOUS | 0 refills | Status: DC
Start: 2023-03-02 — End: 2024-03-03

## 2023-03-02 MED ORDER — CYCLOBENZAPRINE HCL 5 MG PO TABS
5.0000 mg | ORAL_TABLET | Freq: Three times a day (TID) | ORAL | 0 refills | Status: DC
Start: 2023-03-02 — End: 2024-03-03

## 2023-03-02 NOTE — Patient Instructions (Addendum)
Lidocaine 4% patches  Take Ibuprofen 800 mg three times per day with the flexeril

## 2023-03-02 NOTE — Progress Notes (Signed)
Date:  03/02/2023   Name:  Monica Stevenson   DOB:  Jun 18, 1985   MRN:  [id number]   Chief Complaint: Back Pain (Patient fell at skating ring while skating - fell back on her buttocks and hip. X 1 month. Patient did hit her head slightly. Back pain is not getting better.)  Back Pain This is a new problem. The current episode started 1 to 4 weeks ago. The problem occurs daily. The pain is present in the lumbar spine. The quality of the pain is described as aching and cramping. The pain is moderate. The symptoms are aggravated by bending and twisting. Stiffness is present In the morning. Associated symptoms include headaches. Pertinent negatives include no chest pain, fever, numbness or weakness. She has tried NSAIDs for the symptoms. The treatment provided mild relief.    Review of Systems  Constitutional:  Negative for chills, fatigue and fever.  Respiratory:  Negative for chest tightness and shortness of breath.   Cardiovascular:  Negative for chest pain.  Musculoskeletal:  Positive for back pain. Negative for gait problem and joint swelling.  Neurological:  Positive for headaches. Negative for dizziness, weakness, light-headedness and numbness.     Lab Results  Component Value Date   NA 136 11/23/2022   K 3.3 (L) 11/23/2022   CO2 22 11/23/2022   GLUCOSE 208 (H) 11/23/2022   BUN 9 11/23/2022   CREATININE 0.90 11/23/2022   CALCIUM 8.6 (L) 11/23/2022   GFRNONAA >60 11/23/2022   No results found for: "CHOL", "HDL", "LDLCALC", "LDLDIRECT", "TRIG", "CHOLHDL" No results found for: "TSH" No results found for: "HGBA1C" Lab Results  Component Value Date   WBC 7.4 11/23/2022   HGB 9.7 (L) 11/23/2022   HCT 32.6 (L) 11/23/2022   MCV 76.2 (L) 11/23/2022   PLT 418 (H) 11/23/2022   Lab Results  Component Value Date   ALT 16 11/25/2019   AST 15 11/25/2019   ALKPHOS 113 11/25/2019   BILITOT 0.2 11/25/2019   No results found for: "25OHVITD2", "25OHVITD3", "VD25OH"   Patient Active  Problem List   Diagnosis Date Noted   Abnormal Pap smear of cervix 08/22/2022   Localized osteoarthritis of right knee 11/25/2019   Localized edema 11/25/2019   Encounter for commercial driving license (CDL) exam 78/46/9629   Patellofemoral pain syndrome of right knee 03/31/2019   Chronic bilateral low back pain with right-sided sciatica 03/31/2019   Mild intermittent asthma without complication 07/02/2018   Functional diarrhea 07/02/2018   Seborrheic dermatitis of scalp 03/06/2017   Hand discomfort 09/23/2015    Allergies  Allergen Reactions   Tomato Other (See Comments)    Past Surgical History:  Procedure Laterality Date   none      Social History   Tobacco Use   Smoking status: Never   Smokeless tobacco: Never  Vaping Use   Vaping status: Never Used  Substance Use Topics   Alcohol use: Yes    Alcohol/week: 2.0 standard drinks of alcohol    Types: 2 Standard drinks or equivalent per week   Drug use: No     Medication list has been reviewed and updated.  Current Meds  Medication Sig   albuterol (PROVENTIL) (2.5 MG/3ML) 0.083% nebulizer solution Take 3 mLs (2.5 mg total) by nebulization every 6 (six) hours as needed for wheezing or shortness of breath.   albuterol (VENTOLIN HFA) 108 (90 Base) MCG/ACT inhaler Inhale 2 puffs into the lungs every 6 (six) hours as needed for wheezing or shortness  of breath.   budesonide-formoterol (SYMBICORT) 80-4.5 MCG/ACT inhaler Inhale 2 puffs into the lungs 2 (two) times daily.   clotrimazole-betamethasone (LOTRISONE) cream Apply 1 Application topically 2 (two) times daily. For rash under breast   etonogestrel-ethinyl estradiol (NUVARING) 0.12-0.015 MG/24HR vaginal ring Place vaginally.   fluticasone (FLONASE) 50 MCG/ACT nasal spray Place 2 sprays into both nostrils daily.   ibuprofen (ADVIL) 800 MG tablet TAKE 1 TABLET(800 MG) BY MOUTH EVERY 8 HOURS AS NEEDED   [DISCONTINUED] cyclobenzaprine (FLEXERIL) 5 MG tablet 1 tablet PO every  8 hours as needed for muscle spasms   [DISCONTINUED] HYDROcodone-acetaminophen (NORCO) 5-325 MG tablet Take 1 tablet by mouth every 6 (six) hours as needed for moderate pain.   [DISCONTINUED] lidocaine (LIDODERM) 5 % Place 1 patch onto the skin daily. Remove & Discard patch within 12 hours or as directed by MD   [DISCONTINUED] naproxen (NAPROSYN) 500 MG tablet Take 1 tablet (500 mg total) by mouth 2 (two) times daily with a meal.       03/02/2023    4:34 PM 08/22/2022   10:18 AM 04/01/2021    9:16 AM 04/01/2021    9:12 AM  GAD 7 : Generalized Anxiety Score  Nervous, Anxious, on Edge 0 0 0 0  Control/stop worrying 0 0 0 0  Worry too much - different things 0 0 0 0  Trouble relaxing 2 0 0 0  Restless 2 0 0 0  Easily annoyed or irritable 0 0 0 0  Afraid - awful might happen 0 0 0 0  Total GAD 7 Score 4 0 0 0  Anxiety Difficulty Not difficult at all Not difficult at all Not difficult at all Not difficult at all       03/02/2023    4:34 PM 08/22/2022   10:18 AM 04/01/2021    9:16 AM  Depression screen PHQ 2/9  Decreased Interest 2 0 2  Down, Depressed, Hopeless 0 0 0  PHQ - 2 Score 2 0 2  Altered sleeping 2 0 3  Tired, decreased energy 2 0 3  Change in appetite 0 0 0  Feeling bad or failure about yourself  0 0 0  Trouble concentrating 0 0 0  Moving slowly or fidgety/restless 0 0 0  Suicidal thoughts 0 0 0  PHQ-9 Score 6 0 8  Difficult doing work/chores Not difficult at all Not difficult at all Not difficult at all    BP Readings from Last 3 Encounters:  03/02/23 128/78  11/24/22 114/71  08/22/22 120/62    Physical Exam Vitals and nursing note reviewed.  Constitutional:      General: She is not in acute distress.    Appearance: Normal appearance. She is well-developed.  HENT:     Head: Normocephalic and atraumatic.  Cardiovascular:     Rate and Rhythm: Normal rate and regular rhythm.  Pulmonary:     Effort: Pulmonary effort is normal. No respiratory distress.      Breath sounds: No wheezing or rhonchi.  Musculoskeletal:     Cervical back: Spasms and tenderness present. Decreased range of motion.     Lumbar back: Bony tenderness (over both SI regions) present. Negative right straight leg raise test and negative left straight leg raise test.  Skin:    General: Skin is warm and dry.     Findings: No rash.  Neurological:     Mental Status: She is alert and oriented to person, place, and time.     Sensory: Sensation  is intact.     Motor: Motor function is intact.     Gait: Gait is intact.     Deep Tendon Reflexes:     Reflex Scores:      Patellar reflexes are 2+ on the right side and 2+ on the left side. Psychiatric:        Mood and Affect: Mood normal.        Behavior: Behavior normal.     Wt Readings from Last 3 Encounters:  03/02/23 223 lb (101.2 kg)  11/23/22 215 lb (97.5 kg)  08/22/22 222 lb (100.7 kg)    BP 128/78   Pulse 92   Ht 5\' 2"  (1.575 m)   Wt 223 lb (101.2 kg)   SpO2 98%   BMI 40.79 kg/m   Assessment and Plan:  Problem List Items Addressed This Visit   None Visit Diagnoses     Sacroiliac joint pain    -  Primary   no evidence of fracture on exam - c/w contusion which should improve with conservative therapy. recommend out of work for 5 days; Advil, Lidoderm, flexeril   Relevant Medications   lidocaine (LIDODERM) 5 %   Muscle spasm of back       recurrent spasm of neck and shoulders flexeril tid, heat   Relevant Medications   cyclobenzaprine (FLEXERIL) 5 MG tablet       No follow-ups on file.    Reubin Milan, MD [facility] [place]

## 2023-06-29 ENCOUNTER — Ambulatory Visit
Admission: EM | Admit: 2023-06-29 | Discharge: 2023-06-29 | Disposition: A | Discharge status: Home / Self Care | Payer: 59 | Attending: Family Medicine | Admitting: Family Medicine

## 2023-06-29 ENCOUNTER — Encounter: Payer: Self-pay | Admitting: Emergency Medicine

## 2023-06-29 DIAGNOSIS — J101 Influenza due to other identified influenza virus with other respiratory manifestations: Secondary | ICD-10-CM | POA: Insufficient documentation

## 2023-06-29 DIAGNOSIS — J4521 Mild intermittent asthma with (acute) exacerbation: Secondary | ICD-10-CM | POA: Insufficient documentation

## 2023-06-29 LAB — RESP PANEL BY RT-PCR (FLU A&B, COVID) ARPGX2
Influenza A by PCR: POSITIVE — AB
Influenza B by PCR: NEGATIVE
SARS Coronavirus 2 by RT PCR: NEGATIVE

## 2023-06-29 MED ORDER — ALBUTEROL SULFATE HFA 108 (90 BASE) MCG/ACT IN AERS
1.0000 | INHALATION_SPRAY | Freq: Four times a day (QID) | RESPIRATORY_TRACT | 0 refills | Status: AC | PRN
Start: 2023-06-29 — End: ?

## 2023-06-29 MED ORDER — OSELTAMIVIR PHOSPHATE 75 MG PO CAPS
75.0000 mg | ORAL_CAPSULE | Freq: Two times a day (BID) | ORAL | 0 refills | Status: DC
Start: 2023-06-29 — End: 2023-09-19

## 2023-06-29 MED ORDER — PROMETHAZINE-DM 6.25-15 MG/5ML PO SYRP
5.0000 mL | ORAL_SOLUTION | Freq: Four times a day (QID) | ORAL | 0 refills | Status: DC | PRN
Start: 2023-06-29 — End: 2023-07-05

## 2023-06-29 MED ORDER — PREDNISONE 20 MG PO TABS
40.0000 mg | ORAL_TABLET | Freq: Every day | ORAL | 0 refills | Status: AC
Start: 2023-06-29 — End: 2023-07-04

## 2023-06-29 NOTE — Discharge Instructions (Signed)
Start Tamiflu twice daily for 5 days.  I refilled your albuterol inhaler to use as needed for wheezing or shortness of breath.  Prescription for prednisone has been sent for you to hang onto in case you are asthma symptoms worsen.  You may take Promethazine DM as needed for your cough.  Please note this medication will make you drowsy.  Do not drink alcohol or drive on this medication.  Lots of fluids and rest.  Take over-the-counter Tylenol or ibuprofen for fever management and bodyaches.  Please follow-up with your PCP if your symptoms do not improve.  Please go to the ER for any worsening symptoms.  Hope you feel better soon!
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
## 2023-06-29 NOTE — ED Triage Notes (Signed)
Patient reports headache, chills, bodyaches and diarrhea that started last night.  Patient unsure of fevers.

## 2023-07-03 ENCOUNTER — Ambulatory Visit: Payer: Self-pay

## 2023-07-03 NOTE — Telephone Encounter (Signed)
Reason for Disposition  [1] Continuous (nonstop) coughing interferes with work or school AND [2] no improvement using cough treatment per Care Advice    Requesting cough medication with codeine  Answer Assessment - Initial Assessment Questions 1. ONSET: "When did the cough begin?"     She went to the urgent care over the weekend and was diagnosed with the flu.  She is taking Tamiflu but is requesting cough medicine with codiene to help with the coughing and to sleep.   I have the flu.  I'm still coughing so bad.    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  I need something stronger for coughing.  It's not helping.   She has asthma.   She is also Tamiflu.   I went to the urgent care over the weekend.   2. SEVERITY: "How bad is the cough today?"      It's causing a lot of coughing.   I prefer cough medicine with codiene.    3. SPUTUM: "Describe the color of your sputum" (none, dry cough; clear, white, yellow, green)     White/yellow mucus 4. HEMOPTYSIS: "Are you coughing up any blood?" If so ask: "How much?" (flecks, streaks, tablespoons, etc.)     Not asked 5. DIFFICULTY BREATHING: "Are you having difficulty breathing?" If Yes, ask: "How bad is it?" (e.g., mild, moderate, severe)    - MILD: No SOB at rest, mild SOB with walking, speaks normally in sentences, can lie down, no retractions, pulse < 100.    - MODERATE: SOB at rest, SOB with minimal exertion and prefers to sit, cannot lie down flat, speaks in phrases, mild retractions, audible wheezing, pulse 100-120.    - SEVERE: Very SOB at rest, speaks in single words, struggling to breathe, sitting hunched forward, retractions, pulse > 120      I have asthma so I have tightness in my chest.   No shortness of breath.  They gave me a steroid and I'm using my inhaler from the urgent care.    6. FEVER: "Do you have a fever?" If Yes, ask: "What is your temperature, how was it measured, and when did it start?"     No fever 7.  CARDIAC HISTORY: "Do you have any history of heart disease?" (e.g., heart attack, congestive heart failure)      Not asked 8. LUNG HISTORY: "Do you have any history of lung disease?"  (e.g., pulmonary embolus, asthma, emphysema)     asthma 9. PE RISK FACTORS: "Do you have a history of blood clots?" (or: recent major surgery, recent prolonged travel, bedridden)     Not asked 10. OTHER SYMPTOMS: "Do you have any other symptoms?" (e.g., runny nose, wheezing, chest pain)       I'm having chills, nasal congestion, coughing, sore throat, left ear intermittently having pressure.   Voice is hoarse.   11. PREGNANCY: "Is there any chance you are pregnant?" "When was your last menstrual period?"       Not asked 12. TRAVEL: "Have you traveled out of the country in the last month?" (e.g., travel history, exposures)       Symptoms started Friday.   She is on Tamiflu.  Protocols used: Cough - Acute Non-Productive-A-AH  Chief Complaint: Seen over weekend at urgent care and diagnosed with the flu.   Taking Tamiflu and Promethazine for the coughing.   It's not helping.   Requesting cough medicine with codeine.   Would Dr.  Judithann Graves be willing to call it in for her?  She is feeling better with the Tamiflu just the cough is still bad. Symptoms: nasal congestion, sore throat, coughing a lot ear pain on left side. Frequency: Symptoms stawrted Fri. Pertinent Negatives: Patient denies N/A Disposition: [] ED /[] Urgent Care (no appt availability in office) / [] Appointment(In office/virtual)/ []  [facility]/ [] Home Care/ [] Refused Recommended Disposition /[] [facility]/ [x]  Follow-up with PCP Additional Notes: Message sent to Dr. Judithann Graves.    Pt. Agreeable to someone calling her back with Dr. Karn Cassis response for the cough medication with codeine.

## 2023-07-03 NOTE — Telephone Encounter (Signed)
Summary: cough   Pt called saying she went to the urgent care this weekend and has the flu.  She still has a bad cough.  She is using promethazine but it is not helping.  She wants something stronger.  She already has asthma. She is also taking tamiflu.  CB#  [phone]    Called pt - left message on machine to return our call.

## 2023-07-04 ENCOUNTER — Telehealth: Payer: Self-pay

## 2023-07-04 ENCOUNTER — Other Ambulatory Visit: Payer: Self-pay | Admitting: Internal Medicine

## 2023-07-04 DIAGNOSIS — J101 Influenza due to other identified influenza virus with other respiratory manifestations: Secondary | ICD-10-CM

## 2023-07-04 MED ORDER — BENZONATATE 100 MG PO CAPS: 100.0000 mg | ORAL_CAPSULE | Freq: Three times a day (TID) | ORAL | 0 refills | Status: DC

## 2023-07-04 NOTE — Telephone Encounter (Signed)
Please review.  KP  Copied from CRM [id number]. Topic: Clinical - Medication Question >> Jul 04, 2023  8:57 AM Ivette P wrote: Reason for CRM: Pt called in requesting to speak to Nurse, Pt would like a stronger cough medication. Pt requesting callback [phone]

## 2023-07-05 ENCOUNTER — Telehealth: Payer: Self-pay | Admitting: Internal Medicine

## 2023-07-05 ENCOUNTER — Telehealth: Payer: 59 | Admitting: Internal Medicine

## 2023-07-05 ENCOUNTER — Encounter: Payer: Self-pay | Admitting: Internal Medicine

## 2023-07-05 ENCOUNTER — Ambulatory Visit: Payer: Self-pay | Admitting: Internal Medicine

## 2023-07-05 VITALS — Ht 62.0 in | Wt 220.0 lb

## 2023-07-05 DIAGNOSIS — G4733 Obstructive sleep apnea (adult) (pediatric): Secondary | ICD-10-CM

## 2023-07-05 DIAGNOSIS — J101 Influenza due to other identified influenza virus with other respiratory manifestations: Secondary | ICD-10-CM | POA: Diagnosis not present

## 2023-07-05 DIAGNOSIS — F40243 Fear of flying: Secondary | ICD-10-CM

## 2023-07-05 DIAGNOSIS — R051 Acute cough: Secondary | ICD-10-CM

## 2023-07-05 MED ORDER — DIAZEPAM 5 MG PO TABS
2.5000 mg | ORAL_TABLET | Freq: Every day | ORAL | 0 refills | Status: DC | PRN
Start: 2023-07-05 — End: 2023-07-06

## 2023-07-05 MED ORDER — HYDROCODONE BIT-HOMATROP MBR 5-1.5 MG/5ML PO SOLN
5.0000 mL | Freq: Four times a day (QID) | ORAL | 0 refills | Status: DC | PRN
Start: 2023-07-05 — End: 2023-07-15
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
## 2023-07-05 NOTE — Telephone Encounter (Signed)
Called patient and left VM informing of additional cough meds sent in. Told her she does not need to come to todays appt unless she feels its necessary.  - Monica Stevenson

## 2023-07-05 NOTE — Telephone Encounter (Signed)
Copied from CRM [id number]. Topic: General - Billing Inquiry >> Jul 04, 2023 12:58 PM Carlatta H wrote: Reason for CRM: Please call the patient to see if she will receive a charge for today's appointment

## 2023-07-05 NOTE — Telephone Encounter (Signed)
Copied from CRM [id number]. Topic: General - Other >> Jul 05, 2023 11:36 AM Macon Large wrote: Reason for CRM: Patient requests that Chassidy return her call at  [phone]

## 2023-07-05 NOTE — Telephone Encounter (Signed)
Pt scheduled for VV with Dr Judithann Graves today to discuss Codeine Cough Syrup Rx.   Spoke with patient and she wants to keep visit.
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
## 2023-07-06 ENCOUNTER — Telehealth: Payer: Self-pay

## 2023-07-06 ENCOUNTER — Other Ambulatory Visit: Payer: Self-pay | Admitting: Internal Medicine

## 2023-07-06 DIAGNOSIS — F40243 Fear of flying: Secondary | ICD-10-CM

## 2023-07-06 DIAGNOSIS — R051 Acute cough: Secondary | ICD-10-CM

## 2023-07-06 MED ORDER — HYDROCODONE BIT-HOMATROP MBR 5-1.5 MG/5ML PO SOLN
5.0000 mL | Freq: Four times a day (QID) | ORAL | 0 refills | Status: AC | PRN
Start: 2023-07-06 — End: 2023-07-16

## 2023-07-06 MED ORDER — DIAZEPAM 5 MG PO TABS: 2.5000 mg | ORAL_TABLET | Freq: Every day | ORAL | 0 refills | Status: DC | PRN

## 2023-07-06 NOTE — Telephone Encounter (Signed)
Spoke with pt. She needs meds [facility] to [facility] in [place] [facility] of [facility]. Informed Dr Judithann Graves in other CRM note.  Copied from CRM [id number]. Topic: Clinical - Medication Question >> Jul 06, 2023  9:02 AM Franchot Heidelberg wrote: Reason for CRM: Pt is requesting a call back from [facility] regarding her prescription from yesterday  [phone]

## 2023-07-06 NOTE — Telephone Encounter (Signed)
Pt called back regarding her Valium, [facility]. They received the cough syrup just not the valium, please advise   [facility] [address] [place], [place] - [address]  [address] [place] [address]  Phone: [phone] Fax: [phone]

## 2023-07-06 NOTE — Telephone Encounter (Signed)
Called patient. She said her insurance no longer covers [employer]. [facility] [place]. Wants Cough medication sent to [facility] [place] please.  Thank you.  Terri Skains   Copied from CRM [id number]. Topic: Clinical - Prescription Issue >> Jul 06, 2023 11:09 AM Bobbye Morton wrote: Reason for CRM: Pt requesting a call from Dr. Judithann Graves or Terri Skains about her medication being sent to a different pharmacy. Attempted to get more details but pt did not want to speak to call center, Pt would like to speak nurse. [phone]

## 2023-07-26 DIAGNOSIS — Z23 Encounter for immunization: Secondary | ICD-10-CM | POA: Diagnosis not present

## 2023-07-26 DIAGNOSIS — Z01419 Encounter for gynecological examination (general) (routine) without abnormal findings: Secondary | ICD-10-CM | POA: Diagnosis not present

## 2023-07-26 DIAGNOSIS — Z6841 Body Mass Index (BMI) 40.0 and over, adult: Secondary | ICD-10-CM | POA: Diagnosis not present

## 2023-07-26 DIAGNOSIS — Z8742 Personal history of other diseases of the female genital tract: Secondary | ICD-10-CM | POA: Diagnosis not present

## 2023-07-26 DIAGNOSIS — Z113 Encounter for screening for infections with a predominantly sexual mode of transmission: Secondary | ICD-10-CM | POA: Diagnosis not present

## 2023-07-31 DIAGNOSIS — L700 Acne vulgaris: Secondary | ICD-10-CM | POA: Diagnosis not present

## 2023-07-31 DIAGNOSIS — L218 Other seborrheic dermatitis: Secondary | ICD-10-CM | POA: Diagnosis not present

## 2023-08-26 DIAGNOSIS — R519 Headache, unspecified: Secondary | ICD-10-CM | POA: Diagnosis not present

## 2023-08-26 DIAGNOSIS — M542 Cervicalgia: Secondary | ICD-10-CM | POA: Diagnosis not present

## 2023-08-26 DIAGNOSIS — S161XXA Strain of muscle, fascia and tendon at neck level, initial encounter: Secondary | ICD-10-CM | POA: Diagnosis not present

## 2023-08-26 DIAGNOSIS — M62838 Other muscle spasm: Secondary | ICD-10-CM | POA: Diagnosis not present

## 2023-08-26 DIAGNOSIS — M545 Low back pain, unspecified: Secondary | ICD-10-CM | POA: Diagnosis not present

## 2023-08-26 DIAGNOSIS — Y9389 Activity, other specified: Secondary | ICD-10-CM | POA: Diagnosis not present

## 2023-08-26 DIAGNOSIS — Y990 Civilian activity done for income or pay: Secondary | ICD-10-CM | POA: Diagnosis not present

## 2023-08-26 DIAGNOSIS — Y929 Unspecified place or not applicable: Secondary | ICD-10-CM | POA: Diagnosis not present

## 2023-08-26 DIAGNOSIS — S39012A Strain of muscle, fascia and tendon of lower back, initial encounter: Secondary | ICD-10-CM | POA: Diagnosis not present

## 2023-09-07 ENCOUNTER — Encounter: Payer: Self-pay | Admitting: Internal Medicine

## 2023-09-07 ENCOUNTER — Inpatient Hospital Stay: Admitting: Internal Medicine

## 2023-09-18 ENCOUNTER — Ambulatory Visit: Admitting: Internal Medicine

## 2023-09-18 ENCOUNTER — Ambulatory Visit: Payer: Self-pay

## 2023-09-18 NOTE — Telephone Encounter (Signed)
 FYI
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
## 2023-09-19 ENCOUNTER — Encounter: Payer: Self-pay | Admitting: Internal Medicine

## 2023-09-19 ENCOUNTER — Ambulatory Visit
Admission: RE | Admit: 2023-09-19 | Discharge: 2023-09-19 | Disposition: A | Discharge status: Home / Self Care | Source: Ambulatory Visit | Attending: Internal Medicine | Admitting: Internal Medicine

## 2023-09-19 ENCOUNTER — Ambulatory Visit (INDEPENDENT_AMBULATORY_CARE_PROVIDER_SITE_OTHER): Admitting: Internal Medicine

## 2023-09-19 ENCOUNTER — Ambulatory Visit
Admission: RE | Admit: 2023-09-19 | Discharge: 2023-09-19 | Disposition: A | Discharge status: Home / Self Care | Attending: Internal Medicine | Admitting: Internal Medicine

## 2023-09-19 VITALS — BP 126/74 | HR 89 | Ht 62.0 in | Wt 220.1 lb

## 2023-09-19 DIAGNOSIS — R3915 Urgency of urination: Secondary | ICD-10-CM | POA: Diagnosis not present

## 2023-09-19 DIAGNOSIS — M25561 Pain in right knee: Secondary | ICD-10-CM | POA: Diagnosis not present

## 2023-09-19 DIAGNOSIS — M542 Cervicalgia: Secondary | ICD-10-CM

## 2023-09-19 DIAGNOSIS — S161XXA Strain of muscle, fascia and tendon at neck level, initial encounter: Secondary | ICD-10-CM | POA: Insufficient documentation

## 2023-09-19 LAB — POCT URINALYSIS DIPSTICK
Bilirubin, UA: NEGATIVE
Blood, UA: NEGATIVE
Glucose, UA: NEGATIVE
Ketones, UA: NEGATIVE
Leukocytes, UA: NEGATIVE
Nitrite, UA: NEGATIVE
Protein, UA: NEGATIVE
Spec Grav, UA: >=1.03 — AB (ref 1.010–1.025)
Urobilinogen, UA: 0.2 U/dL
pH, UA: 6.5 (ref 5.0–8.0)

## 2023-09-19 MED ORDER — IBUPROFEN 800 MG PO TABS
ORAL_TABLET | ORAL | 1 refills | Status: DC
Start: 2023-09-19 — End: 2023-09-27
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
## 2023-09-21 ENCOUNTER — Ambulatory Visit: Admitting: Internal Medicine

## 2023-09-27 ENCOUNTER — Ambulatory Visit (INDEPENDENT_AMBULATORY_CARE_PROVIDER_SITE_OTHER): Admitting: Family Medicine

## 2023-09-27 ENCOUNTER — Encounter: Admitting: Family Medicine

## 2023-09-27 ENCOUNTER — Encounter: Payer: Self-pay | Admitting: Family Medicine

## 2023-09-27 VITALS — BP 102/70 | HR 81 | Ht 62.0 in | Wt 226.0 lb

## 2023-09-27 DIAGNOSIS — M25561 Pain in right knee: Secondary | ICD-10-CM | POA: Diagnosis not present

## 2023-09-27 MED ORDER — DICLOFENAC SODIUM 75 MG PO TBEC: 75.0000 mg | DELAYED_RELEASE_TABLET | Freq: Two times a day (BID) | ORAL | 0 refills | Status: DC

## 2023-09-27 NOTE — Patient Instructions (Signed)
 Patient Plan  Right Knee Pain:  1. Wear the patellar stabilizing hinged knee brace as fitted. 2. Take Diclofenac twice daily with food. Do not use other NSAIDs. 3. Attend physical therapy sessions for pain management and strengthening exercises. 4. Use Flexeril  at night as needed for additional relief. 5. Schedule a follow-up appointment in six weeks to check your progress.  Red Flags:  - Contact our office if there is no improvement in your symptoms within two weeks, or at any time if your symptoms worsen, to discuss earlier intervention options such as a cortisone injection.
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
## 2023-10-10 ENCOUNTER — Telehealth: Payer: Self-pay | Admitting: Internal Medicine

## 2023-10-10 NOTE — Telephone Encounter (Signed)
Please review patient's message:

## 2023-10-10 NOTE — Telephone Encounter (Signed)
 Copied from CRM [id number]. Topic: Referral - Request for Referral >> Oct 10, 2023  8:20 AM Rosaria Common wrote: Did the patient discuss referral with their provider in the last year? Yes (If No - schedule appointment) (If Yes - send message)  Appointment offered? Yes   Type of order/referral and detailed reason for visit: Physical Therapy  Preference of office, provider, location: [place],   If referral order, have you been seen by this specialty before? Yes (If Yes, this issue or another issue? When? Where?   Can we respond through MyChart? Yes

## 2023-10-12 ENCOUNTER — Other Ambulatory Visit: Payer: Self-pay

## 2023-10-12 ENCOUNTER — Encounter: Payer: Self-pay | Admitting: Emergency Medicine

## 2023-10-12 ENCOUNTER — Emergency Department: Admission: EM | Admit: 2023-10-12 | Discharge: 2023-10-12 | Disposition: A | Discharge status: Home / Self Care

## 2023-10-12 DIAGNOSIS — R350 Frequency of micturition: Secondary | ICD-10-CM | POA: Insufficient documentation

## 2023-10-12 DIAGNOSIS — J0100 Acute maxillary sinusitis, unspecified: Secondary | ICD-10-CM | POA: Insufficient documentation

## 2023-10-12 DIAGNOSIS — R519 Headache, unspecified: Secondary | ICD-10-CM | POA: Diagnosis present

## 2023-10-12 LAB — RESP PANEL BY RT-PCR (RSV, FLU A&B, COVID)  RVPGX2
Influenza A by PCR: NEGATIVE
Influenza B by PCR: NEGATIVE
Resp Syncytial Virus by PCR: NEGATIVE
SARS Coronavirus 2 by RT PCR: NEGATIVE

## 2023-10-12 MED ORDER — CEPHALEXIN 750 MG PO CAPS: 750.0000 mg | ORAL_CAPSULE | Freq: Three times a day (TID) | ORAL | 0 refills | Status: AC

## 2023-10-12 MED ORDER — IBUPROFEN 800 MG PO TABS
800.0000 mg | ORAL_TABLET | Freq: Once | ORAL | Status: AC
  Administered 2023-10-12: 800 mg via ORAL
  Filled 2023-10-12: qty 1

## 2023-10-12 MED ORDER — CEPHALEXIN 500 MG PO CAPS
500.0000 mg | ORAL_CAPSULE | Freq: Once | ORAL | Status: AC
  Administered 2023-10-12: 500 mg via ORAL
  Filled 2023-10-12: qty 1

## 2023-10-12 NOTE — ED Provider Notes (Signed)
 [facility] Provider Note    Event Date/Time   First MD Initiated Contact with Patient 10/12/23 2207     (approximate)   History   Headache and Generalized Body Aches    HPI  Monica Stevenson is a 38 y.o. female    with a past medical history of cervical radiculopathy, right knee arthralgia, who presents to the ED complaining of frontal headache. According to the patient, symptoms started 2 days ago, nasal congestion, chills, decreased energy, facial pressure.  Today patient states having headaches.  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  Patient states having frequency.  No dysuria.  Has menstrual period 1 week ago.  Patient denies vaginal discharge.      Physical Exam   Triage Vital Signs: ED Triage Vitals  Encounter Vitals Group     BP 10/12/23 1950 137/84     Systolic BP Percentile --      Diastolic BP Percentile --      Pulse Rate 10/12/23 1952 97     Resp 10/12/23 1950 18     Temp 10/12/23 1950 99.2 F (37.3 C)     Temp Source 10/12/23 1950 Oral     SpO2 10/12/23 1950 98 %     Weight --      Height --      Head Circumference --      Peak Flow --      Pain Score 10/12/23 1951 8     Pain Loc --      Pain Education --      Exclude from Growth Chart --     Most recent vital signs: Vitals:   10/12/23 1950 10/12/23 1952  BP: 137/84   Pulse:  97  Resp: 18   Temp: 99.2 F (37.3 C)   SpO2: 98%      Constitutional: Alert, NAD. Able to speak in complete sentences without cough or dyspnea  Eyes: Conjunctivae are normal.  Head: Atraumatic.  Tender to palpation in frontal and maxillary sinuses.  Nose: No congestion/rhinnorhea. Ears: Left otoscopy: Presence of a fluid.  Right otoscopy: Normal limits Mouth/Throat: Mucous membranes are moist.  Tonsillar hypertrophy. Neck: Painless ROM. Supple. No JVD, nodes, thyromegaly  Cardiovascular:   Good peripheral circulation.RRR no murmurs,  gallops, rubs  Respiratory: Normal respiratory effort.  No retractions. Clear to auscultation bilaterally without wheezing or crackles  Gastrointestinal: Soft and nontender.  Musculoskeletal:  no deformity Neurologic:  MAE spontaneously. No gross focal neurologic deficits are appreciated.  Skin:  Skin is warm, dry and intact. No rash noted. Psychiatric: Mood and affect are normal. Speech and behavior are normal.    ED Results / Procedures / Treatments   Labs (all labs ordered are listed, but only abnormal results are displayed) Labs Reviewed  RESP PANEL BY RT-PCR (RSV, FLU A&B, COVID)  [id number]     EKG See physician read    RADIOLOGY     PROCEDURES:  Critical Care performed:   Procedures   MEDICATIONS ORDERED IN ED: Medications  cephALEXin (KEFLEX) capsule 500 mg (500 mg Oral Given 10/12/23 2235)  ibuprofen  (ADVIL ) tablet 800 mg (800 mg Oral Given 10/12/23 2234)   Clinical Course as of 10/12/23 2237  Fri Oct 12, 2023  2210 Resp panel by RT-PCR (RSV, Flu A&B, Covid) Anterior Nasal Swab Negative [AE]    Clinical Course User Index [AE] Awilda Lennox, PA-C    IMPRESSION / MDM /  ASSESSMENT AND PLAN / ED COURSE  I reviewed the triage vital signs and the nursing notes.  Differential diagnosis includes, but is not limited to, sinus infection, UTI, viral upper respiratory infection  Patient's presentation is most consistent with acute complicated illness / injury requiring diagnostic workup.  Patient's diagnosis is consistent with sinus infection. I did not order chest x-ray, physical exam is reassuring.  Labs are  reassuring ruling out RSV, COVID, influenza. I did review the patient's allergies and medications.The patient is in stable and satisfactory condition for discharge home  Patient will be discharged home with prescriptions for cephalexin.  Recommended the patient to use nasal wash, nasal decongestant, drink plenty of fluids patient is to follow up with PCP as  needed or otherwise directed. Patient is given ED precautions to return to the ED for any worsening or new symptoms. Discussed plan of care with patient, answered all of patient's questions, Patient agreeable to plan of care. Advised patient to take medications according to the instructions on the label. Discussed possible side effects of new medications. Patient verbalized understanding.    FINAL CLINICAL IMPRESSION(S) / ED DIAGNOSES   Final diagnoses:  Acute maxillary sinusitis, recurrence not specified     Rx / DC Orders   ED Discharge Orders          Ordered    cephALEXin (KEFLEX) 750 MG capsule  3 times daily        10/12/23 2234             Note:  This document was prepared using Dragon voice recognition software and may include unintentional dictation errors.   Awilda Lennox, PA-C 10/12/23 2237    Collis Deaner, MD 10/13/23 0001

## 2023-10-12 NOTE — ED Triage Notes (Signed)
 Pt states this week she has felt tired.  Today began having chills, body aches, and headache.

## 2023-10-12 NOTE — Discharge Instructions (Addendum)
 You have been diagnosed with sinus infection.  Please take Keflex 1 capsule by mouth 3 times after main meals for 7 days.  Please perform nose rinse.  Please take nasal decongestant like DayQuil.  Please drink plenty of fluids.  Come back to ED or go to your PCP if you have new symptoms or symptoms worsen.

## 2023-10-15 ENCOUNTER — Encounter: Payer: Self-pay | Admitting: Physical Therapy

## 2023-10-15 ENCOUNTER — Ambulatory Visit: Payer: Self-pay | Attending: Internal Medicine | Admitting: Physical Therapy

## 2023-10-15 DIAGNOSIS — M6281 Muscle weakness (generalized): Secondary | ICD-10-CM | POA: Insufficient documentation

## 2023-10-15 DIAGNOSIS — M542 Cervicalgia: Secondary | ICD-10-CM | POA: Insufficient documentation

## 2023-10-15 DIAGNOSIS — M79601 Pain in right arm: Secondary | ICD-10-CM | POA: Insufficient documentation
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
## 2023-10-17 ENCOUNTER — Ambulatory Visit: Payer: Self-pay | Admitting: Physical Therapy

## 2023-10-17 ENCOUNTER — Encounter: Payer: Self-pay | Admitting: Physical Therapy

## 2023-10-17 DIAGNOSIS — M79601 Pain in right arm: Secondary | ICD-10-CM

## 2023-10-17 DIAGNOSIS — M6281 Muscle weakness (generalized): Secondary | ICD-10-CM

## 2023-10-17 DIAGNOSIS — M542 Cervicalgia: Secondary | ICD-10-CM
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
## 2023-10-23 ENCOUNTER — Ambulatory Visit: Payer: Self-pay | Admitting: Physical Therapy

## 2023-10-29 ENCOUNTER — Encounter: Admitting: Physical Therapy

## 2023-10-31 ENCOUNTER — Encounter: Admitting: Physical Therapy

## 2023-11-01 ENCOUNTER — Encounter: Payer: Self-pay | Admitting: Physical Therapy

## 2023-11-01 ENCOUNTER — Ambulatory Visit: Payer: Self-pay

## 2023-11-01 DIAGNOSIS — M79601 Pain in right arm: Secondary | ICD-10-CM

## 2023-11-01 DIAGNOSIS — M542 Cervicalgia: Secondary | ICD-10-CM

## 2023-11-01 DIAGNOSIS — M6281 Muscle weakness (generalized): Secondary | ICD-10-CM
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
## 2023-11-05 ENCOUNTER — Ambulatory Visit: Payer: Self-pay | Admitting: Physical Therapy

## 2023-11-05 ENCOUNTER — Encounter: Admitting: Physical Therapy

## 2023-11-05 DIAGNOSIS — M6281 Muscle weakness (generalized): Secondary | ICD-10-CM

## 2023-11-05 DIAGNOSIS — M542 Cervicalgia: Secondary | ICD-10-CM

## 2023-11-05 DIAGNOSIS — M79601 Pain in right arm: Secondary | ICD-10-CM
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
## 2023-11-06 ENCOUNTER — Ambulatory Visit: Payer: Self-pay | Admitting: Physical Therapy

## 2023-11-06 ENCOUNTER — Encounter: Payer: Self-pay | Admitting: Physical Therapy

## 2023-11-07 ENCOUNTER — Encounter: Admitting: Physical Therapy

## 2023-11-07 ENCOUNTER — Ambulatory Visit: Payer: Self-pay | Admitting: Physical Therapy

## 2023-11-07 DIAGNOSIS — M542 Cervicalgia: Secondary | ICD-10-CM

## 2023-11-07 DIAGNOSIS — M6281 Muscle weakness (generalized): Secondary | ICD-10-CM

## 2023-11-07 DIAGNOSIS — M79601 Pain in right arm: Secondary | ICD-10-CM
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
## 2023-11-08 ENCOUNTER — Ambulatory Visit: Payer: Self-pay | Admitting: Physical Therapy

## 2023-11-08 ENCOUNTER — Encounter: Payer: Self-pay | Admitting: Physical Therapy

## 2023-11-12 ENCOUNTER — Ambulatory Visit: Payer: Self-pay | Admitting: Physical Therapy

## 2023-11-12 ENCOUNTER — Encounter: Payer: Self-pay | Admitting: Internal Medicine

## 2023-11-12 ENCOUNTER — Ambulatory Visit: Admitting: Family Medicine
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
## 2023-11-14 ENCOUNTER — Ambulatory Visit: Payer: Self-pay | Admitting: Physical Therapy

## 2023-11-19 ENCOUNTER — Encounter: Admitting: Physical Therapy

## 2023-11-21 ENCOUNTER — Encounter: Admitting: Physical Therapy

## 2023-11-21 ENCOUNTER — Ambulatory Visit: Payer: Self-pay | Attending: Internal Medicine | Admitting: Physical Therapy

## 2023-11-21 DIAGNOSIS — M79601 Pain in right arm: Secondary | ICD-10-CM | POA: Insufficient documentation

## 2023-11-21 DIAGNOSIS — M25561 Pain in right knee: Secondary | ICD-10-CM | POA: Insufficient documentation

## 2023-11-21 DIAGNOSIS — M6281 Muscle weakness (generalized): Secondary | ICD-10-CM | POA: Insufficient documentation

## 2023-11-21 DIAGNOSIS — M542 Cervicalgia: Secondary | ICD-10-CM | POA: Insufficient documentation
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
## 2023-11-26 ENCOUNTER — Ambulatory Visit: Payer: Self-pay | Admitting: Physical Therapy

## 2023-11-26 DIAGNOSIS — M6281 Muscle weakness (generalized): Secondary | ICD-10-CM

## 2023-11-26 DIAGNOSIS — M79601 Pain in right arm: Secondary | ICD-10-CM

## 2023-11-26 DIAGNOSIS — M25561 Pain in right knee: Secondary | ICD-10-CM

## 2023-11-26 DIAGNOSIS — M542 Cervicalgia: Secondary | ICD-10-CM
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
## 2023-11-27 ENCOUNTER — Encounter: Payer: Self-pay | Admitting: Physical Therapy
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
## 2023-11-28 ENCOUNTER — Ambulatory Visit: Payer: Self-pay | Admitting: Physical Therapy

## 2023-11-28 DIAGNOSIS — M542 Cervicalgia: Secondary | ICD-10-CM

## 2023-11-28 DIAGNOSIS — M79601 Pain in right arm: Secondary | ICD-10-CM

## 2023-11-28 DIAGNOSIS — M6281 Muscle weakness (generalized): Secondary | ICD-10-CM

## 2023-11-28 DIAGNOSIS — M25561 Pain in right knee: Secondary | ICD-10-CM
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
## 2023-11-29 ENCOUNTER — Encounter: Payer: Self-pay | Admitting: Physical Therapy

## 2023-12-04 ENCOUNTER — Ambulatory Visit: Payer: Self-pay | Admitting: Physical Therapy
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
## 2023-12-05 ENCOUNTER — Ambulatory Visit: Payer: Self-pay | Admitting: Physical Therapy

## 2023-12-05 DIAGNOSIS — M6281 Muscle weakness (generalized): Secondary | ICD-10-CM

## 2023-12-05 DIAGNOSIS — M79601 Pain in right arm: Secondary | ICD-10-CM

## 2023-12-05 DIAGNOSIS — M542 Cervicalgia: Secondary | ICD-10-CM

## 2023-12-05 DIAGNOSIS — M25561 Pain in right knee: Secondary | ICD-10-CM
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
## 2023-12-07 ENCOUNTER — Encounter: Payer: Self-pay | Admitting: Physical Therapy

## 2023-12-07 ENCOUNTER — Ambulatory Visit: Payer: Self-pay | Admitting: Family Medicine

## 2023-12-12 ENCOUNTER — Ambulatory Visit: Payer: Self-pay | Admitting: Physical Therapy

## 2023-12-18 ENCOUNTER — Ambulatory Visit: Payer: Self-pay | Admitting: Physical Therapy
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
## 2023-12-20 ENCOUNTER — Ambulatory Visit: Payer: Self-pay | Attending: Internal Medicine | Admitting: Physical Therapy

## 2023-12-20 DIAGNOSIS — M79601 Pain in right arm: Secondary | ICD-10-CM | POA: Insufficient documentation

## 2023-12-20 DIAGNOSIS — M6281 Muscle weakness (generalized): Secondary | ICD-10-CM | POA: Insufficient documentation

## 2023-12-20 DIAGNOSIS — M542 Cervicalgia: Secondary | ICD-10-CM | POA: Insufficient documentation

## 2023-12-20 DIAGNOSIS — M25561 Pain in right knee: Secondary | ICD-10-CM | POA: Insufficient documentation
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
## 2023-12-24 ENCOUNTER — Ambulatory Visit: Payer: Self-pay | Admitting: Physical Therapy

## 2023-12-24 ENCOUNTER — Encounter: Payer: Self-pay | Admitting: Physical Therapy

## 2023-12-24 DIAGNOSIS — M542 Cervicalgia: Secondary | ICD-10-CM

## 2023-12-24 DIAGNOSIS — M6281 Muscle weakness (generalized): Secondary | ICD-10-CM

## 2023-12-24 DIAGNOSIS — M25561 Pain in right knee: Secondary | ICD-10-CM

## 2023-12-24 DIAGNOSIS — M79601 Pain in right arm: Secondary | ICD-10-CM
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
## 2023-12-25 ENCOUNTER — Ambulatory Visit: Payer: Self-pay | Admitting: Family Medicine

## 2023-12-26 ENCOUNTER — Ambulatory Visit: Payer: Self-pay | Admitting: Physical Therapy

## 2023-12-26 DIAGNOSIS — M25561 Pain in right knee: Secondary | ICD-10-CM

## 2023-12-26 DIAGNOSIS — M542 Cervicalgia: Secondary | ICD-10-CM

## 2023-12-26 DIAGNOSIS — M79601 Pain in right arm: Secondary | ICD-10-CM

## 2023-12-26 DIAGNOSIS — M6281 Muscle weakness (generalized): Secondary | ICD-10-CM
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
## 2023-12-31 ENCOUNTER — Ambulatory Visit: Payer: Self-pay | Admitting: Family Medicine

## 2024-02-26 ENCOUNTER — Ambulatory Visit: Payer: Self-pay | Attending: Internal Medicine | Admitting: Physical Therapy

## 2024-02-26 DIAGNOSIS — M6281 Muscle weakness (generalized): Secondary | ICD-10-CM | POA: Insufficient documentation

## 2024-02-26 DIAGNOSIS — M25561 Pain in right knee: Secondary | ICD-10-CM | POA: Insufficient documentation

## 2024-02-26 DIAGNOSIS — M542 Cervicalgia: Secondary | ICD-10-CM | POA: Insufficient documentation

## 2024-02-26 DIAGNOSIS — M79601 Pain in right arm: Secondary | ICD-10-CM | POA: Insufficient documentation
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
## 2024-02-29 DIAGNOSIS — Z23 Encounter for immunization: Secondary | ICD-10-CM | POA: Diagnosis not present

## 2024-03-03 ENCOUNTER — Ambulatory Visit
Admission: RE | Admit: 2024-03-03 | Discharge: 2024-03-03 | Disposition: A | Discharge status: Home / Self Care | Attending: Family Medicine | Admitting: Family Medicine

## 2024-03-03 ENCOUNTER — Ambulatory Visit
Admission: RE | Admit: 2024-03-03 | Discharge: 2024-03-03 | Disposition: A | Discharge status: Home / Self Care | Source: Ambulatory Visit | Attending: Family Medicine | Admitting: Family Medicine

## 2024-03-03 ENCOUNTER — Ambulatory Visit: Admitting: Physical Therapy

## 2024-03-03 ENCOUNTER — Ambulatory Visit: Payer: Self-pay | Admitting: Internal Medicine

## 2024-03-03 ENCOUNTER — Encounter: Payer: Self-pay | Admitting: Family Medicine

## 2024-03-03 ENCOUNTER — Ambulatory Visit (INDEPENDENT_AMBULATORY_CARE_PROVIDER_SITE_OTHER): Payer: Self-pay | Admitting: Radiology

## 2024-03-03 ENCOUNTER — Ambulatory Visit: Payer: Self-pay | Admitting: Family Medicine

## 2024-03-03 VITALS — BP 112/74 | HR 75 | Ht 62.0 in | Wt 219.0 lb

## 2024-03-03 DIAGNOSIS — M4802 Spinal stenosis, cervical region: Secondary | ICD-10-CM | POA: Diagnosis not present

## 2024-03-03 DIAGNOSIS — M25561 Pain in right knee: Secondary | ICD-10-CM | POA: Diagnosis not present

## 2024-03-03 DIAGNOSIS — M5412 Radiculopathy, cervical region: Secondary | ICD-10-CM | POA: Insufficient documentation

## 2024-03-03 DIAGNOSIS — M47812 Spondylosis without myelopathy or radiculopathy, cervical region: Secondary | ICD-10-CM | POA: Diagnosis not present

## 2024-03-03 DIAGNOSIS — S199XXA Unspecified injury of neck, initial encounter: Secondary | ICD-10-CM | POA: Diagnosis not present

## 2024-03-03 DIAGNOSIS — M25811 Other specified joint disorders, right shoulder: Secondary | ICD-10-CM | POA: Diagnosis not present

## 2024-03-03 MED ORDER — GABAPENTIN 300 MG PO CAPS: 300.0000 mg | ORAL_CAPSULE | Freq: Every evening | ORAL | 0 refills | Status: DC | PRN

## 2024-03-03 MED ORDER — MELOXICAM 15 MG PO TABS: 15.0000 mg | ORAL_TABLET | Freq: Every day | ORAL | 0 refills | Status: DC

## 2024-03-03 MED ORDER — TIZANIDINE HCL 2 MG PO TABS: 2.0000 mg | ORAL_TABLET | Freq: Three times a day (TID) | ORAL | 0 refills | Status: DC | PRN
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
## 2024-03-03 NOTE — Assessment & Plan Note (Signed)
 RIGHT SHOULDER INSPECTION: No deformity, swelling, or erythema. Normal alignment. PALPATION: Non-tender at bicipital groove and subacromial space. RANGE OF MOTION: Full forward flexion, abduction, and rotation without limitation. STRENGTH: Bilateral rotator cuff strength 5/5 with external rotation, internal rotation, and supraspinatus testing; mild discomfort on the right with isolated supraspinatus testing. NEUROLOGICAL: Sensation intact to light touch in the right upper extremity, no motor deficits. SPECIAL TESTS: Positive Neer's test on the right; negative Hawkins and Speed's tests.  Right shoulder pain with impingement and tendinitis Right shoulder pain with impingement and tendinitis, likely due to compensatory overuse from cervical spine issues. - Continue physical therapy focusing on shoulder rehabilitation. - Monitor symptoms and adjust treatment based on response to cervical spine interventions.
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
## 2024-03-06 ENCOUNTER — Ambulatory Visit: Attending: Internal Medicine | Admitting: Physical Therapy

## 2024-03-06 ENCOUNTER — Ambulatory Visit: Payer: Self-pay | Admitting: Family Medicine

## 2024-03-06 DIAGNOSIS — M6281 Muscle weakness (generalized): Secondary | ICD-10-CM | POA: Insufficient documentation

## 2024-03-06 DIAGNOSIS — M542 Cervicalgia: Secondary | ICD-10-CM | POA: Insufficient documentation

## 2024-03-06 DIAGNOSIS — M25561 Pain in right knee: Secondary | ICD-10-CM | POA: Insufficient documentation

## 2024-03-06 DIAGNOSIS — M79601 Pain in right arm: Secondary | ICD-10-CM | POA: Insufficient documentation
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
## 2024-03-10 ENCOUNTER — Ambulatory Visit: Admitting: Physical Therapy

## 2024-03-10 DIAGNOSIS — M6281 Muscle weakness (generalized): Secondary | ICD-10-CM | POA: Diagnosis not present

## 2024-03-10 DIAGNOSIS — M25561 Pain in right knee: Secondary | ICD-10-CM | POA: Diagnosis not present

## 2024-03-10 DIAGNOSIS — M542 Cervicalgia: Secondary | ICD-10-CM | POA: Diagnosis not present

## 2024-03-10 DIAGNOSIS — M79601 Pain in right arm: Secondary | ICD-10-CM | POA: Diagnosis not present
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
## 2024-03-11 ENCOUNTER — Ambulatory Visit: Admitting: Physical Therapy

## 2024-03-12 ENCOUNTER — Encounter: Payer: Self-pay | Admitting: Physical Therapy

## 2024-03-17 ENCOUNTER — Telehealth: Payer: Self-pay

## 2024-03-17 NOTE — Telephone Encounter (Signed)
 Please review. The pre-service center is needing authorization for MRI of Neck and Knee.

## 2024-03-17 NOTE — Telephone Encounter (Signed)
 Copied from CRM [id number]. Topic: General - Call Back - No Documentation >> Mar 17, 2024 12:25 PM Geneva B wrote: Reason for CRM:   pre service is calling  about patient authorization  [place] [phone]

## 2024-03-18 ENCOUNTER — Other Ambulatory Visit: Payer: Self-pay

## 2024-03-18 NOTE — Progress Notes (Signed)
 Disregard.  JM

## 2024-03-19 ENCOUNTER — Ambulatory Visit
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
## 2024-03-20 ENCOUNTER — Ambulatory Visit: Attending: Internal Medicine | Admitting: Physical Therapy

## 2024-03-20 DIAGNOSIS — M25561 Pain in right knee: Secondary | ICD-10-CM | POA: Insufficient documentation

## 2024-03-20 DIAGNOSIS — M6281 Muscle weakness (generalized): Secondary | ICD-10-CM | POA: Insufficient documentation

## 2024-03-20 DIAGNOSIS — M79601 Pain in right arm: Secondary | ICD-10-CM | POA: Insufficient documentation

## 2024-03-20 DIAGNOSIS — M542 Cervicalgia: Secondary | ICD-10-CM | POA: Insufficient documentation

## 2024-03-25 ENCOUNTER — Encounter: Payer: Self-pay | Admitting: Family Medicine

## 2024-04-02 ENCOUNTER — Ambulatory Visit
Admission: RE | Admit: 2024-04-02 | Discharge: 2024-04-02 | Disposition: A | Discharge status: Home / Self Care | Source: Ambulatory Visit | Attending: Family Medicine | Admitting: Family Medicine

## 2024-04-02 ENCOUNTER — Ambulatory Visit: Admission: RE | Admit: 2024-04-02 | Source: Ambulatory Visit

## 2024-04-02 DIAGNOSIS — M5412 Radiculopathy, cervical region: Secondary | ICD-10-CM

## 2024-04-02 DIAGNOSIS — M50122 Cervical disc disorder at C5-C6 level with radiculopathy: Secondary | ICD-10-CM | POA: Diagnosis not present

## 2024-04-02 DIAGNOSIS — M50121 Cervical disc disorder at C4-C5 level with radiculopathy: Secondary | ICD-10-CM | POA: Diagnosis not present

## 2024-04-09 ENCOUNTER — Ambulatory Visit
Admission: RE | Admit: 2024-04-09 | Discharge: 2024-04-09 | Disposition: A | Discharge status: Home / Self Care | Source: Ambulatory Visit | Attending: Family Medicine | Admitting: Family Medicine

## 2024-04-09 DIAGNOSIS — M25561 Pain in right knee: Secondary | ICD-10-CM

## 2024-04-09 DIAGNOSIS — M25571 Pain in right ankle and joints of right foot: Secondary | ICD-10-CM | POA: Diagnosis not present

## 2024-04-30 ENCOUNTER — Ambulatory Visit: Admitting: Family Medicine

## 2024-04-30 DIAGNOSIS — M25561 Pain in right knee: Secondary | ICD-10-CM | POA: Diagnosis not present

## 2024-04-30 DIAGNOSIS — M4722 Other spondylosis with radiculopathy, cervical region: Secondary | ICD-10-CM

## 2024-04-30 DIAGNOSIS — M5412 Radiculopathy, cervical region: Secondary | ICD-10-CM

## 2024-04-30 DIAGNOSIS — M1711 Unilateral primary osteoarthritis, right knee: Secondary | ICD-10-CM | POA: Diagnosis not present

## 2024-04-30 MED ORDER — MELOXICAM 15 MG PO TABS: 15.0000 mg | ORAL_TABLET | Freq: Every day | ORAL | 0 refills | Status: AC

## 2024-04-30 MED ORDER — GABAPENTIN 300 MG PO CAPS: 300.0000 mg | ORAL_CAPSULE | Freq: Every day | ORAL | 0 refills | Status: AC

## 2024-04-30 MED ORDER — TIZANIDINE HCL 2 MG PO TABS: 2.0000 mg | ORAL_TABLET | Freq: Three times a day (TID) | ORAL | 0 refills | Status: AC | PRN

## 2024-04-30 NOTE — Patient Instructions (Addendum)
 VISIT SUMMARY:  Today, we reviewed your recent imaging results and discussed ongoing symptoms related to your right knee cartilage injury and cervical disc disorder. We outlined a plan to manage your pain and improve your mobility.  YOUR PLAN:  RIGHT KNEE CARTILAGE INJURY WITH BONE BRUISE: You have a small cartilage fissure and bone bruise in your right knee, which is causing persistent pain and swelling. -We reviewed your MRI findings and discussed that the bone edema should resolve in about three months, but the cartilage injury may persist. -We will submit for insurance authorization for viscosupplementation to help with symptom relief. -Please obtain your MRI images from [facility] for our records. -Continue taking meloxicam  as prescribed for its anti-inflammatory effects, which are stronger than ibuprofen . -Check MyChart for detailed plan and medication instructions. -Continue with your physical therapy exercises. -Reach out if your pain worsens or does not improve.  CERVICAL DISC DISORDER WITH RADICULOPATHY: You have multilevel disc bulges in your cervical spine causing neck pain and right arm symptoms. -We reviewed your MRI findings, which show disc bulges and neural impingement. -Take your prescribed medications (meloxicam , gabapentin , tizanidine ) as directed. Do not take these with additional ibuprofen . -Your prescriptions for meloxicam , gabapentin , and tizanidine  have been sent to the pharmacy. -Check MyChart for medication instructions and plan. -We have referred you to an interventional spine specialist for possible cervical epidural steroid injections if your symptoms persist or worsen -Please call your spine group [phone] ) and reference the most recent referral from October. -Resume your home physical therapy exercises. -Your imaging and visit notes are available in MyChart. -Reach out if your symptoms worsen. -Expect follow-up scheduling for an in-office visit in  approximately three months from your last injection.
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
## 2024-05-01 DIAGNOSIS — U071 COVID-19: Secondary | ICD-10-CM | POA: Diagnosis not present

## 2024-05-01 DIAGNOSIS — L708 Other acne: Secondary | ICD-10-CM | POA: Diagnosis not present

## 2024-05-01 DIAGNOSIS — J4520 Mild intermittent asthma, uncomplicated: Secondary | ICD-10-CM | POA: Diagnosis not present

## 2024-05-06 ENCOUNTER — Ambulatory Visit: Admitting: Family Medicine

## 2024-05-06 VITALS — BP 112/64 | HR 93 | Ht 62.0 in | Wt 215.0 lb

## 2024-05-06 DIAGNOSIS — U071 COVID-19: Secondary | ICD-10-CM | POA: Diagnosis not present

## 2024-05-06 LAB — POC COVID19 BINAXNOW: SARS Coronavirus 2 Ag: NEGATIVE

## 2024-05-06 NOTE — Patient Instructions (Addendum)
 YOUR PLAN:  COVID-19 INFECTION: You are recovering from COVID-19 with some lingering symptoms like cough and sweats. -Follow CDC guidelines for returning to activities. Make sure you are fever-free for 24 hours without using fever-reducing medications and that your symptoms are improving before ending isolation. -Continue wearing a mask for at least 5 days after meeting the criteria to end isolation, especially around vulnerable individuals. -Maintain at least 6 feet of distance from others when masked, particularly during family gatherings. -Monitor your temperature at home and seek care if it reaches 100.72F or higher. -Drink plenty of water and avoid sugary beverages to stay hydrated. -Continue to rest as needed. -Complete your course of Paxlovid. -Use cough syrup as needed. -Follow the written instructions provided for a safe return to activities and prevention post-COVID.  ASTHMA: You experienced mild shortness of breath during your COVID-19 infection, which responded to your inhaler. -Continue using your inhaler as needed for asthma symptoms.

## 2024-05-07 ENCOUNTER — Encounter: Payer: Self-pay | Admitting: Family Medicine

## 2024-05-07 DIAGNOSIS — U071 COVID-19: Secondary | ICD-10-CM | POA: Insufficient documentation

## 2024-05-07 NOTE — Assessment & Plan Note (Signed)
 History of Present Illness Monica Stevenson is a 38 year old female with knee osteoarthritis and cervicalgia who presents for follow-up regarding pending gel injections and spine referral.  Knee pain and osteoarthritis - Symptomatic knee osteoarthritis with ongoing pain. - Awaiting scheduling of intra-articular gel injections. - No contact received from referral office regarding injection scheduling. - Seeking confirmation that referral for gel injections was sent.  Neck pain and cervicalgia - Persistent neck pain with concern for cervical spine pathology. - Referral to spine group initiated on October 20th, but no confirmation or appointment received. - Continued neck symptoms prompting request for guidance on scheduling evaluation.  Recent covid-19 infection - Diagnosed with COVID-19 on December 18th. - Completed nearly a full course of Paxlovid. - Intermittent cough and sweats persist. - Improvement in headache and myalgias. - Practicing isolation at home and wearing a mask around others due to concern for transmission, especially given her father's recent surgery.  Physical Exam VITALS: P- 93, SaO2- 98% CHEST: Lungs clear to auscultation bilaterally with good air flow.  Results Labs COVID-19 PCR (05/06/2024): Negative  Assessment and Plan COVID-19 infection Recovering from COVID-19 with improving symptoms. Persistent cough and diaphoresis, but afebrile and stable oxygen saturation. Counseling on transmission risk provided. - Reviewed CDC guidelines for return to activities, emphasizing 24 hours afebrile without antipyretics and improving symptoms prior to ending isolation. - Advised continued masking for at least 5 days after meeting criteria to end isolation, especially around vulnerable individuals. - Recommended maintaining at least 6 feet of distance from others when masked, particularly during family gatherings. - Advised home monitoring for fever with a threshold of  100.72F. - Encouraged increased water intake and avoidance of sugary beverages for hydration. - Recommended continued rest as needed. - Advised completion of Paxlovid course. - Provided anticipatory guidance on when to seek further care if symptoms worsen or fail to improve. - Permitted use of cough syrup as needed. - Provided written instructions regarding safe return and prevention post-COVID.  Asthma Asthma with mild shortness of breath during COVID-19, responsive to inhaler. - Advised continued use of inhaler as needed for asthma symptoms.

## 2024-05-07 NOTE — Progress Notes (Signed)
 "    Primary Care / Sports Medicine Office Visit  Patient Information:  Patient ID: Monica Stevenson, female DOB: 10/21/1985 Age: 38 y.o. MRN: [id number]   Monica Stevenson is a pleasant 38 y.o. female presenting with the following:  Chief Complaint  Patient presents with   Covid Positive    Vitals:   05/06/24 1543  BP: 112/64  Pulse: 93  SpO2: 98%   Vitals:   05/06/24 1543  Weight: 215 lb (97.5 kg)  Height: 5' 2 (1.575 m)   Body mass index is 39.32 kg/m.  Discussed the use of AI scribe software for clinical note transcription with the patient, who gave verbal consent to proceed.   Independent interpretation of notes and tests performed by another provider:   None  Procedures performed:   None  Pertinent History, Exam, Impression, and Recommendations:   Problem List Items Addressed This Visit     COVID - Primary   History of Present Illness Monica Stevenson is a 38 year old female with knee osteoarthritis and cervicalgia who presents for follow-up regarding pending gel injections and spine referral.  Knee pain and osteoarthritis - Symptomatic knee osteoarthritis with ongoing pain. - Awaiting scheduling of intra-articular gel injections. - No contact received from referral office regarding injection scheduling. - Seeking confirmation that referral for gel injections was sent.  Neck pain and cervicalgia - Persistent neck pain with concern for cervical spine pathology. - Referral to spine group initiated on October 20th, but no confirmation or appointment received. - Continued neck symptoms prompting request for guidance on scheduling evaluation.  Recent covid-19 infection - Diagnosed with COVID-19 on December 18th. - Completed nearly a full course of Paxlovid. - Intermittent cough and sweats persist. - Improvement in headache and myalgias. - Practicing isolation at home and wearing a mask around others due to concern for transmission, especially given her father's recent  surgery.  Physical Exam VITALS: P- 93, SaO2- 98% CHEST: Lungs clear to auscultation bilaterally with good air flow.  Results Labs COVID-19 PCR (05/06/2024): Negative  Assessment and Plan COVID-19 infection Recovering from COVID-19 with improving symptoms. Persistent cough and diaphoresis, but afebrile and stable oxygen saturation. Counseling on transmission risk provided. - Reviewed CDC guidelines for return to activities, emphasizing 24 hours afebrile without antipyretics and improving symptoms prior to ending isolation. - Advised continued masking for at least 5 days after meeting criteria to end isolation, especially around vulnerable individuals. - Recommended maintaining at least 6 feet of distance from others when masked, particularly during family gatherings. - Advised home monitoring for fever with a threshold of 100.78F. - Encouraged increased water intake and avoidance of sugary beverages for hydration. - Recommended continued rest as needed. - Advised completion of Paxlovid course. - Provided anticipatory guidance on when to seek further care if symptoms worsen or fail to improve. - Permitted use of cough syrup as needed. - Provided written instructions regarding safe return and prevention post-COVID.  Asthma Asthma with mild shortness of breath during COVID-19, responsive to inhaler. - Advised continued use of inhaler as needed for asthma symptoms.      Relevant Orders   POC COVID-19 BinaxNow (Completed)     Orders & Medications Medications: No orders of the defined types were placed in this encounter.  Orders Placed This Encounter  Procedures   POC COVID-19 BinaxNow     No follow-ups on file.     Selinda JINNY Ku, MD, CAQSM   Primary Care Sports Medicine Primary Care and Sports Medicine at  [facility]   "

## 2024-05-12 NOTE — Telephone Encounter (Signed)
 Please review.  KP

## 2024-05-12 NOTE — Telephone Encounter (Signed)
"  Submitted   "

## 2024-05-21 ENCOUNTER — Telehealth: Payer: Self-pay

## 2024-05-21 ENCOUNTER — Encounter: Payer: Self-pay | Admitting: Family Medicine

## 2024-05-21 NOTE — Telephone Encounter (Signed)
 Called [employer]  pre certification line to get specialty pharmacy information to complete pre cert for monovisc. I was informed Patient no longer has Community Education Officer for insurance.  JM

## 2024-05-30 ENCOUNTER — Telehealth: Payer: Self-pay

## 2024-05-30 NOTE — Telephone Encounter (Signed)
 Prior shara was denied, will submit for appeal.  JM

## 2024-06-17 NOTE — Telephone Encounter (Signed)
 Spoke with Abdiel with [facility]. Informed him of [employer] as insurance we have on file for patient and cross referenced Policy Num which was the same in their records. Patient insurance is terminated and we will not be pursuing gel injection at this time since insurance is not valid.  JM

## 2024-06-19 ENCOUNTER — Ambulatory Visit: Admitting: Student in an Organized Health Care Education/Training Program

## 2024-07-17 ENCOUNTER — Ambulatory Visit: Admitting: Student in an Organized Health Care Education/Training Program
